# Patient Record
Sex: Female | Born: 2000 | Race: Black or African American | Hispanic: No | Marital: Married | Smoking: Former smoker
Health system: Southern US, Community
[De-identification: ages and names within clinical notes are randomized; demographics above are authoritative.]

## PROBLEM LIST (undated history)

## (undated) DIAGNOSIS — C801 Malignant (primary) neoplasm, unspecified: Secondary | ICD-10-CM

## (undated) DIAGNOSIS — T7840XA Allergy, unspecified, initial encounter: Secondary | ICD-10-CM

## (undated) HISTORY — PX: HERNIA REPAIR: SHX51

---

## 2020-03-13 ENCOUNTER — Other Ambulatory Visit: Payer: Self-pay

## 2020-03-13 ENCOUNTER — Ambulatory Visit (HOSPITAL_COMMUNITY)
Admission: EM | Admit: 2020-03-13 | Discharge: 2020-03-13 | Disposition: A | Discharge status: Home / Self Care | Payer: Managed Care, Other (non HMO) | Attending: Family Medicine | Admitting: Family Medicine

## 2020-03-13 ENCOUNTER — Encounter (HOSPITAL_COMMUNITY): Payer: Self-pay

## 2020-03-13 DIAGNOSIS — M545 Low back pain, unspecified: Secondary | ICD-10-CM | POA: Diagnosis not present

## 2020-03-13 HISTORY — DX: Allergy, unspecified, initial encounter: T78.40XA

## 2020-03-13 MED ORDER — IBUPROFEN 800 MG PO TABS: 800.0000 mg | ORAL_TABLET | Freq: Three times a day (TID) | ORAL | 0 refills | Status: DC

## 2020-03-13 MED ORDER — CYCLOBENZAPRINE HCL 5 MG PO TABS
5.0000 mg | ORAL_TABLET | Freq: Two times a day (BID) | ORAL | 0 refills | Status: DC | PRN
Start: 2020-03-13 — End: 2021-06-14

## 2020-03-13 NOTE — Discharge Instructions (Signed)
Use anti-inflammatories for pain/swelling. You may take up to 800 mg Ibuprofen every 8 hours with food. You may supplement Ibuprofen with Tylenol [phone] mg every 8 hours.   You may use flexeril as needed to help with pain. This is a muscle relaxer and causes sedation- please use only at bedtime or when you will be home and not have to drive/work  Gentle stretching Alternate ice and heat  Follow up if not improving or worsening

## 2020-03-13 NOTE — ED Triage Notes (Signed)
Pt presents with bilateral lower back pain since yesterday.

## 2020-03-15 NOTE — ED Provider Notes (Signed)
[place]    CSN: [id number] Arrival date & time: 03/13/20  1610      History   Chief Complaint Chief Complaint  Patient presents with  . Back Pain    HPI Laura Sharp is a 19 y.o. female presenting today for evaluation of back pain.  Reports that she has had lower back pain for approximately 1 to 2 days.  Symptoms began yesterday.  Denies any specific injury or trauma.  Denies radiation into extremities.  Denies numbness or tingling.  Denies saddle anesthesia.  Denies urinary symptoms or loss of control of bowel/bladder.  HPI  Past Medical History:  Diagnosis Date  . Allergy     There are no problems to display for this patient.   Past Surgical History:  Procedure Laterality Date  . HERNIA REPAIR      OB History   No obstetric history on file.      Home Medications    Prior to Admission medications   Medication Sig Start Date End Date Taking? Authorizing Provider  fluticasone (FLONASE) 50 MCG/ACT nasal spray Place into both nostrils daily.   Yes [provider]  cyclobenzaprine (FLEXERIL) 5 MG tablet Take 1-2 tablets (5-10 mg total) by mouth 2 (two) times daily as needed for muscle spasms. 03/13/20   Kaylee Wombles C, PA-C  ibuprofen (ADVIL) 800 MG tablet Take 1 tablet (800 mg total) by mouth 3 (three) times daily. 03/13/20   Gilman Olazabal, Elesa Hacker, PA-C    Family History Family History  Family history unknown: Yes    Social History Social History   Tobacco Use  . Smoking status: Current Some Day Smoker  Substance Use Topics  . Alcohol use: Not on file  . Drug use: Not on file     Allergies   Penicillins   Review of Systems Review of Systems  Constitutional: Negative for fatigue and fever.  Eyes: Negative for visual disturbance.  Respiratory: Negative for shortness of breath.   Cardiovascular: Negative for chest pain.  Gastrointestinal: Negative for abdominal pain, nausea and vomiting.  Genitourinary: Negative for  decreased urine volume and difficulty urinating.  Musculoskeletal: Positive for back pain and myalgias. Negative for arthralgias and joint swelling.  Skin: Negative for color change, rash and wound.  Neurological: Negative for dizziness, weakness, light-headedness and headaches.     Physical Exam Triage Vital Signs ED Triage Vitals  Enc Vitals Group     BP 03/13/20 1720 126/70     Pulse Rate 03/13/20 1720 81     Resp 03/13/20 1720 17     Temp 03/13/20 1720 98.6 F (37 C)     Temp Source 03/13/20 1720 Oral     SpO2 03/13/20 1720 99 %     Weight --      Height --      Head Circumference --      Peak Flow --      Pain Score 03/13/20 1715 9     Pain Loc --      Pain Edu? --      Excl. in [place]? --    No data found.  Updated Vital Signs BP 126/70 (BP Location: Right Arm)   Pulse 81   Temp 98.6 F (37 C) (Oral)   Resp 17   LMP 02/14/2020   SpO2 99%   Visual Acuity Right Eye Distance:   Left Eye Distance:   Bilateral Distance:    Right Eye Near:   Left Eye Near:  Bilateral Near:     Physical Exam Vitals and nursing note reviewed.  Constitutional:      Appearance: She is well-developed.     Comments: No acute distress  HENT:     Head: Normocephalic and atraumatic.     Nose: Nose normal.  Eyes:     Conjunctiva/sclera: Conjunctivae normal.  Cardiovascular:     Rate and Rhythm: Normal rate.  Pulmonary:     Effort: Pulmonary effort is normal. No respiratory distress.  Abdominal:     General: There is no distension.  Musculoskeletal:        General: Normal range of motion.     Cervical back: Neck supple.     Comments: Nontender to palpation of cervical and thoracic spine midline, diffuse tenderness throughout lumbar spine midline as well as bilateral lumbar musculature more laterally Strength at hips and knees 5/5 equal bilaterally, patellar reflex 2+ bilaterally Gait without abnormality, ambulates from chair to exam table without any assistance  Skin:     General: Skin is warm and dry.  Neurological:     Mental Status: She is alert and oriented to person, place, and time.      UC Treatments / Results  Labs (all labs ordered are listed, but only abnormal results are displayed) Labs Reviewed - No data to display  EKG   Radiology No results found.  Procedures Procedures (including critical care time)  Medications Ordered in UC Medications - No data to display  Initial Impression / Assessment and Plan / UC Course  I have reviewed the triage vital signs and the nursing notes.  Pertinent labs & imaging results that were available during my care of the patient were reviewed by me and considered in my medical decision making (see chart for details).     Low back pain without mechanism of injury, low suspicion of acute bony abnormality, recommending treatment with anti-inflammatories and muscle relaxers, activity modification and gentle stretching.  Monitor for gradual resolution.  Discussed strict return precautions. Patient verbalized understanding and is agreeable with plan.  Final Clinical Impressions(s) / UC Diagnoses   Final diagnoses:  Acute bilateral low back pain without sciatica     Discharge Instructions     Use anti-inflammatories for pain/swelling. You may take up to 800 mg Ibuprofen every 8 hours with food. You may supplement Ibuprofen with Tylenol [phone] mg every 8 hours.   You may use flexeril as needed to help with pain. This is a muscle relaxer and causes sedation- please use only at bedtime or when you will be home and not have to drive/work  Gentle stretching Alternate ice and heat  Follow up if not improving or worsening   ED Prescriptions    Medication Sig Dispense Auth. Provider   ibuprofen (ADVIL) 800 MG tablet Take 1 tablet (800 mg total) by mouth 3 (three) times daily. 21 tablet Kingstyn Deruiter C, PA-C   cyclobenzaprine (FLEXERIL) 5 MG tablet Take 1-2 tablets (5-10 mg total) by mouth 2 (two)  times daily as needed for muscle spasms. 24 tablet Nyilah Kight, [place] C, PA-C     PDMP not reviewed this encounter.   Janith Lima, [place] 03/15/20 1633

## 2020-05-23 ENCOUNTER — Other Ambulatory Visit: Payer: Managed Care, Other (non HMO)

## 2020-05-27 ENCOUNTER — Other Ambulatory Visit: Payer: Managed Care, Other (non HMO)

## 2020-05-28 ENCOUNTER — Other Ambulatory Visit: Payer: Managed Care, Other (non HMO)

## 2020-05-28 ENCOUNTER — Other Ambulatory Visit: Payer: Self-pay

## 2020-05-28 DIAGNOSIS — Z20822 Contact with and (suspected) exposure to covid-19: Secondary | ICD-10-CM

## 2020-05-30 LAB — NOVEL CORONAVIRUS, NAA

## 2020-05-30 LAB — SARS-COV-2, NAA 2 DAY TAT

## 2020-08-02 ENCOUNTER — Encounter (HOSPITAL_COMMUNITY): Payer: Self-pay | Admitting: Emergency Medicine

## 2020-08-02 ENCOUNTER — Emergency Department (HOSPITAL_COMMUNITY)
Admission: EM | Admit: 2020-08-02 | Discharge: 2020-08-02 | Disposition: A | Discharge status: Home / Self Care | Payer: Managed Care, Other (non HMO) | Attending: Emergency Medicine | Admitting: Emergency Medicine

## 2020-08-02 DIAGNOSIS — A64 Unspecified sexually transmitted disease: Secondary | ICD-10-CM | POA: Insufficient documentation

## 2020-08-02 DIAGNOSIS — F17200 Nicotine dependence, unspecified, uncomplicated: Secondary | ICD-10-CM | POA: Diagnosis not present

## 2020-08-02 DIAGNOSIS — N898 Other specified noninflammatory disorders of vagina: Secondary | ICD-10-CM | POA: Insufficient documentation

## 2020-08-02 LAB — POC URINE PREG, ED: Preg Test, Ur: NEGATIVE

## 2020-08-02 LAB — URINALYSIS, ROUTINE W REFLEX MICROSCOPIC
Bilirubin Urine: NEGATIVE
Glucose, UA: NEGATIVE mg/dL
Hgb urine dipstick: NEGATIVE
Ketones, ur: 5 mg/dL — AB
Nitrite: NEGATIVE
Protein, ur: NEGATIVE mg/dL
Specific Gravity, Urine: 1.026 (ref 1.005–1.030)
pH: 5 (ref 5.0–8.0)

## 2020-08-02 LAB — WET PREP, GENITAL
Sperm: NONE SEEN
Trich, Wet Prep: NONE SEEN
Yeast Wet Prep HPF POC: NONE SEEN

## 2020-08-02 LAB — HIV ANTIBODY (ROUTINE TESTING W REFLEX): HIV Screen 4th Generation wRfx: NONREACTIVE

## 2020-08-02 MED ORDER — DOXYCYCLINE HYCLATE 100 MG PO CAPS: 100.0000 mg | ORAL_CAPSULE | Freq: Two times a day (BID) | ORAL | 0 refills | Status: AC

## 2020-08-02 MED ORDER — METRONIDAZOLE 500 MG PO TABS: 500.0000 mg | ORAL_TABLET | Freq: Two times a day (BID) | ORAL | 0 refills | Status: AC

## 2020-08-02 MED ORDER — CEFTRIAXONE SODIUM 500 MG IJ SOLR
500.0000 mg | Freq: Once | INTRAMUSCULAR | Status: AC
  Administered 2020-08-02: 500 mg via INTRAMUSCULAR
  Filled 2020-08-02: qty 500

## 2020-08-02 MED ORDER — STERILE WATER FOR INJECTION IJ SOLN
INTRAMUSCULAR | Status: AC
  Administered 2020-08-02: 10 mL
  Filled 2020-08-02: qty 10

## 2020-08-02 NOTE — Discharge Instructions (Addendum)
You were given a prescription for 2 antibiotics called Flagyl and doxycycline.  You will need to fill the prescription for the Flagyl.  Do not drink alcohol taking this.  If your chlamydia test is positive you will need to fill the prescription and take the doxycycline as well.  You have been tested for HIV, syphilis, chlamydia and gonorrhea.  These results will be available in approximately 3 days and you will be contacted by the hospital if the results are positive. Avoid sexual contact until you are aware of the results, and please inform all sexual partners if you test positive for any of these diseases.  Please follow up with your primary care provider within 5-7 days for re-evaluation of your symptoms. If you do not have a primary care provider, information for a healthcare clinic has been provided for you to make arrangements for follow up care. Please return to the emergency department for any new or worsening symptoms.

## 2020-08-02 NOTE — ED Triage Notes (Addendum)
Requesting STD testing.  Reports vaginal discharge and odor that started today.

## 2020-08-02 NOTE — ED Provider Notes (Signed)
[facility] EMERGENCY DEPARTMENT Provider Note   CSN: [id number] Arrival date & time: 08/02/20  1324     History Chief Complaint  Patient presents with  . SEXUALLY TRANSMITTED DISEASE    Laura Sharp is a 20 y.o. female.  HPI   20 year old female with a history of allergies presents the emergency department today for evaluation of vaginal discharge.  States she had unprotected intercourse with her boyfriend a week ago who had penile discharge.  She noted vaginal discharge today.  She denies any pelvic pain, bleeding or other associated symptoms.  She is requesting an STD check.  Past Medical History:  Diagnosis Date  . Allergy     There are no problems to display for this patient.   Past Surgical History:  Procedure Laterality Date  . HERNIA REPAIR       OB History   No obstetric history on file.     Family History  Family history unknown: Yes    Social History   Tobacco Use  . Smoking status: Current Some Day Smoker    Home Medications Prior to Admission medications   Medication Sig Start Date End Date Taking? Authorizing Provider  doxycycline (VIBRAMYCIN) 100 MG capsule Take 1 capsule (100 mg total) by mouth 2 (two) times daily for 7 days. 08/02/20 08/09/20 Yes Cheryll Keisler S, PA-C  metroNIDAZOLE (FLAGYL) 500 MG tablet Take 1 tablet (500 mg total) by mouth 2 (two) times daily for 7 days. 08/02/20 08/09/20 Yes Rylinn Linzy S, PA-C  cyclobenzaprine (FLEXERIL) 5 MG tablet Take 1-2 tablets (5-10 mg total) by mouth 2 (two) times daily as needed for muscle spasms. 03/13/20   Wieters, Hallie C, PA-C  fluticasone (FLONASE) 50 MCG/ACT nasal spray Place into both nostrils daily.    [provider]  ibuprofen (ADVIL) 800 MG tablet Take 1 tablet (800 mg total) by mouth 3 (three) times daily. 03/13/20   Wieters, Hallie C, PA-C    Allergies    Penicillins  Review of Systems   Review of Systems  Constitutional: Negative for fever.  Eyes:  Negative for visual disturbance.  Respiratory: Negative for shortness of breath.   Cardiovascular: Negative for chest pain.  Gastrointestinal: Negative for abdominal pain.  Genitourinary: Positive for vaginal discharge. Negative for dysuria, frequency, pelvic pain, urgency and vaginal bleeding.  Musculoskeletal: Negative for back pain.    Physical Exam Updated Vital Signs BP 126/67 (BP Location: Right Arm)   Pulse 60   Temp 99 F (37.2 C) (Oral)   Resp 16   LMP 07/26/2020   SpO2 98%   Physical Exam Vitals and nursing note reviewed.  Constitutional:      General: She is not in acute distress.    Appearance: She is well-developed.  HENT:     Head: Normocephalic and atraumatic.  Eyes:     Conjunctiva/sclera: Conjunctivae normal.  Cardiovascular:     Rate and Rhythm: Normal rate.  Pulmonary:     Effort: Pulmonary effort is normal.  Genitourinary:    Comments: Exam performed by Rodney Booze,  exam chaperoned Date: 08/02/2020 Pelvic exam: normal external genitalia without evidence of trauma. VULVA: normal appearing vulva with no masses, tenderness or lesion. VAGINA: normal appearing vagina with normal color and discharge, no lesions. CERVIX: normal appearing cervix without lesions, cervical os with out purulent discharge; vaginal discharge - no significant discharge present, Wet prep and DNA probe for chlamydia and GC obtained.   Bimanual exam deferred Musculoskeletal:  General: Normal range of motion.     Cervical back: Neck supple.  Skin:    General: Skin is warm and dry.  Neurological:     Mental Status: She is alert.     ED Results / Procedures / Treatments   Labs (all labs ordered are listed, but only abnormal results are displayed) Labs Reviewed  WET PREP, GENITAL - Abnormal; Notable for the following components:      Result Value   Clue Cells Wet Prep HPF POC PRESENT (*)    WBC, Wet Prep HPF POC MODERATE (*)    All other components within normal  limits  URINALYSIS, ROUTINE W REFLEX MICROSCOPIC - Abnormal; Notable for the following components:   APPearance HAZY (*)    Ketones, ur 5 (*)    Leukocytes,Ua TRACE (*)    Bacteria, UA RARE (*)    All other components within normal limits  HIV ANTIBODY (ROUTINE TESTING W REFLEX)  RPR  POC URINE PREG, ED  GC/CHLAMYDIA PROBE AMP ([place]) NOT AT [facility]    EKG None  Radiology No results found.  Procedures Procedures   Medications Ordered in ED Medications  cefTRIAXone (ROCEPHIN) injection 500 mg (500 mg Intramuscular Given 08/02/20 1616)  sterile water (preservative free) injection (10 mLs  Given 08/02/20 1617)    ED Course  I have reviewed the triage vital signs and the nursing notes.  Pertinent labs & imaging results that were available during my care of the patient were reviewed by me and considered in my medical decision making (see chart for details).    MDM Rules/Calculators/A&P                          Patient to be discharged with instructions to follow up with OBGYN. Discussed importance of using protection when sexually active. Pt understands that they have GC/Chlamydia cultures pending and that they will need to inform all sexual partners if results return positive. Pt has been treated prophylacticly with rocephin due to pts history, pelvic exam, and wet prep with increased WBCs. hx not concerning for PID because hemodynamically stable and pelvic pain present. Pt has also been treated with flagyl for Bacterial Vaginosis. Pt has been advised to not drink alcohol while on this medication.   Final Clinical Impression(s) / ED Diagnoses Final diagnoses:  Vaginal discharge    Rx / DC Orders ED Discharge Orders         Ordered    doxycycline (VIBRAMYCIN) 100 MG capsule  2 times daily        08/02/20 1610    metroNIDAZOLE (FLAGYL) 500 MG tablet  2 times daily        08/02/20 [address] [place], PA-C 08/02/20 1622    Daleen Bo,  MD 08/02/20 1842

## 2020-08-02 NOTE — ED Notes (Signed)
Pelvic cart at bedside. 

## 2020-08-03 LAB — RPR: RPR Ser Ql: NONREACTIVE

## 2020-08-04 LAB — GC/CHLAMYDIA PROBE AMP (~~LOC~~) NOT AT ARMC
Chlamydia: NEGATIVE
Comment: NEGATIVE
Comment: NORMAL
Neisseria Gonorrhea: POSITIVE — AB

## 2020-12-21 ENCOUNTER — Encounter (HOSPITAL_COMMUNITY): Payer: Self-pay | Admitting: Emergency Medicine

## 2020-12-21 ENCOUNTER — Emergency Department (HOSPITAL_COMMUNITY)
Admission: EM | Admit: 2020-12-21 | Discharge: 2020-12-21 | Disposition: A | Discharge status: Home / Self Care | Payer: Managed Care, Other (non HMO) | Attending: Emergency Medicine | Admitting: Emergency Medicine

## 2020-12-21 ENCOUNTER — Other Ambulatory Visit: Payer: Self-pay

## 2020-12-21 DIAGNOSIS — R350 Frequency of micturition: Secondary | ICD-10-CM | POA: Diagnosis not present

## 2020-12-21 DIAGNOSIS — H538 Other visual disturbances: Secondary | ICD-10-CM | POA: Insufficient documentation

## 2020-12-21 DIAGNOSIS — R1032 Left lower quadrant pain: Secondary | ICD-10-CM | POA: Diagnosis not present

## 2020-12-21 DIAGNOSIS — Z5321 Procedure and treatment not carried out due to patient leaving prior to being seen by health care provider: Secondary | ICD-10-CM | POA: Insufficient documentation

## 2020-12-21 LAB — CBC WITH DIFFERENTIAL/PLATELET
Abs Immature Granulocytes: 0.02 10*3/uL (ref 0.00–0.07)
Basophils Absolute: 0 10*3/uL (ref 0.0–0.1)
Basophils Relative: 1 %
Eosinophils Absolute: 0.1 10*3/uL (ref 0.0–0.5)
Eosinophils Relative: 1 %
HCT: 36.3 % (ref 36.0–46.0)
Hemoglobin: 12.1 g/dL (ref 12.0–15.0)
Immature Granulocytes: 0 %
Lymphocytes Relative: 34 %
Lymphs Abs: 2.3 10*3/uL (ref 0.7–4.0)
MCH: 33.3 pg (ref 26.0–34.0)
MCHC: 33.3 g/dL (ref 30.0–36.0)
MCV: 100 fL (ref 80.0–100.0)
Monocytes Absolute: 0.5 10*3/uL (ref 0.1–1.0)
Monocytes Relative: 8 %
Neutro Abs: 3.8 10*3/uL (ref 1.7–7.7)
Neutrophils Relative %: 56 %
Platelets: 270 10*3/uL (ref 150–400)
RBC: 3.63 MIL/uL — ABNORMAL LOW (ref 3.87–5.11)
RDW: 12 % (ref 11.5–15.5)
WBC: 6.7 10*3/uL (ref 4.0–10.5)
nRBC: 0 % (ref 0.0–0.2)

## 2020-12-21 LAB — URINALYSIS, ROUTINE W REFLEX MICROSCOPIC
Bilirubin Urine: NEGATIVE
Glucose, UA: NEGATIVE mg/dL
Ketones, ur: NEGATIVE mg/dL
Leukocytes,Ua: NEGATIVE
Nitrite: NEGATIVE
Protein, ur: NEGATIVE mg/dL
RBC / HPF: >50 RBC/hpf — ABNORMAL HIGH (ref 0–5)
Specific Gravity, Urine: 1.018 (ref 1.005–1.030)
pH: 5 (ref 5.0–8.0)

## 2020-12-21 LAB — COMPREHENSIVE METABOLIC PANEL
ALT: 14 U/L (ref 0–44)
AST: 13 U/L — ABNORMAL LOW (ref 15–41)
Albumin: 3.9 g/dL (ref 3.5–5.0)
Alkaline Phosphatase: 36 U/L — ABNORMAL LOW (ref 38–126)
Anion gap: 6 (ref 5–15)
BUN: 7 mg/dL (ref 6–20)
CO2: 24 mmol/L (ref 22–32)
Calcium: 8.9 mg/dL (ref 8.9–10.3)
Chloride: 107 mmol/L (ref 98–111)
Creatinine, Ser: 0.8 mg/dL (ref 0.44–1.00)
GFR, Estimated: >60 mL/min (ref >60)
Glucose, Bld: 104 mg/dL — ABNORMAL HIGH (ref 70–99)
Potassium: 3.5 mmol/L (ref 3.5–5.1)
Sodium: 137 mmol/L (ref 135–145)
Total Bilirubin: 1 mg/dL (ref 0.3–1.2)
Total Protein: 6.8 g/dL (ref 6.5–8.1)

## 2020-12-21 LAB — POC URINE PREG, ED: Preg Test, Ur: NEGATIVE

## 2020-12-21 LAB — CBG MONITORING, ED: Glucose-Capillary: 88 mg/dL (ref 70–99)

## 2020-12-21 LAB — LIPASE, BLOOD: Lipase: 38 U/L (ref 11–51)

## 2020-12-21 NOTE — ED Notes (Signed)
Patient states she has to work at [employer] and is leaving. States she will make a doctor appointment

## 2020-12-21 NOTE — ED Triage Notes (Signed)
C/o LLQ cramps x 1 week with frequent urination.  Denies nausea, vomiting, diarrhea, and constipation.  Pt also requesting to be tested for diabetes.  Reports blurred vision when she bends over and stands back up for 3-4 months and she believes it may be related to diabetes.

## 2020-12-21 NOTE — ED Provider Notes (Signed)
Emergency Medicine Provider Triage Evaluation Note  Laura Sharp , a 20 y.o. female  was evaluated in triage.  Pt complains of intermittent left lower abdominal cramping x1 week.  Patient also having frequent urination and noticed a malodorous smell to vaginal area.  Denies any vaginal discharge.  LMP was finished x3 days ago.  Patient admits to being sexually active with males and females with protection.  She is also concerned to be diabetic as she was told she is prediabetic in the past..  Review of Systems  Positive: Abdominal pain, urinary frequency Negative: Fever, chills, dysuria, gross hematuria, vaginal discharge  Physical Exam  BP 135/74 (BP Location: Right Arm)   Pulse 78   Temp 98.1 F (36.7 C)   Resp 14   LMP 12/18/2020   SpO2 98%  Gen:   Awake, no distress   Resp:  Normal effort  MSK:   Moves extremities without difficulty  Other:  Mild tenderness to palpation of left lower quadrant without peritoneal signs.  Medical Decision Making  Medically screening exam initiated at 6:21 PM.  Appropriate orders placed.  Laura Sharp was informed that the remainder of the evaluation will be completed by another provider, this initial triage assessment does not replace that evaluation, and the importance of remaining in the ED until their evaluation is complete.  Abdominal labs ordered.    Portions of this note were generated with Lobbyist. Dictation errors may occur despite best attempts at proofreading.    Barrie Folk, PA-C 12/21/20 1823    Jeanell Sparrow, DO 12/21/20 2212

## 2020-12-30 ENCOUNTER — Emergency Department (HOSPITAL_COMMUNITY)
Admission: EM | Admit: 2020-12-30 | Discharge: 2020-12-30 | Disposition: A | Discharge status: Home / Self Care | Payer: Managed Care, Other (non HMO) | Attending: Emergency Medicine | Admitting: Emergency Medicine

## 2020-12-30 ENCOUNTER — Other Ambulatory Visit: Payer: Self-pay

## 2020-12-30 ENCOUNTER — Encounter (HOSPITAL_COMMUNITY): Payer: Self-pay | Admitting: Emergency Medicine

## 2020-12-30 DIAGNOSIS — F17200 Nicotine dependence, unspecified, uncomplicated: Secondary | ICD-10-CM | POA: Insufficient documentation

## 2020-12-30 DIAGNOSIS — Z202 Contact with and (suspected) exposure to infections with a predominantly sexual mode of transmission: Secondary | ICD-10-CM | POA: Diagnosis present

## 2020-12-30 DIAGNOSIS — Z7689 Persons encountering health services in other specified circumstances: Secondary | ICD-10-CM

## 2020-12-30 LAB — URINALYSIS, ROUTINE W REFLEX MICROSCOPIC
Bilirubin Urine: NEGATIVE
Glucose, UA: NEGATIVE mg/dL
Hgb urine dipstick: NEGATIVE
Ketones, ur: NEGATIVE mg/dL
Leukocytes,Ua: NEGATIVE
Nitrite: NEGATIVE
Protein, ur: NEGATIVE mg/dL
Specific Gravity, Urine: 1.024 (ref 1.005–1.030)
pH: 6 (ref 5.0–8.0)

## 2020-12-30 LAB — GC/CHLAMYDIA PROBE AMP (~~LOC~~) NOT AT ARMC
Chlamydia: NEGATIVE
Comment: NEGATIVE
Comment: NORMAL
Neisseria Gonorrhea: NEGATIVE

## 2020-12-30 LAB — HIV ANTIBODY (ROUTINE TESTING W REFLEX): HIV Screen 4th Generation wRfx: NONREACTIVE

## 2020-12-30 LAB — PREGNANCY, URINE: Preg Test, Ur: NEGATIVE

## 2020-12-30 LAB — RPR: RPR Ser Ql: NONREACTIVE

## 2020-12-30 MED ORDER — DOXYCYCLINE HYCLATE 100 MG PO TABS
100.0000 mg | ORAL_TABLET | Freq: Once | ORAL | Status: AC
  Administered 2020-12-30: 100 mg via ORAL
  Filled 2020-12-30: qty 1

## 2020-12-30 MED ORDER — CEFTRIAXONE SODIUM 500 MG IJ SOLR
500.0000 mg | Freq: Once | INTRAMUSCULAR | Status: AC
  Administered 2020-12-30: 500 mg via INTRAMUSCULAR
  Filled 2020-12-30: qty 500

## 2020-12-30 MED ORDER — DOXYCYCLINE HYCLATE 100 MG PO CAPS: 100.0000 mg | ORAL_CAPSULE | Freq: Two times a day (BID) | ORAL | 0 refills | Status: DC

## 2020-12-30 NOTE — ED Provider Notes (Signed)
[facility] EMERGENCY DEPARTMENT Provider Note   CSN: [id number] Arrival date & time: 12/30/20  0016     History Chief Complaint  Patient presents with   STD check    Laura Sharp is a 20 y.o. female presents to the emergency department with concerns for possible STD exposure.  She reports that her boyfriend is having penile discharge and she is concerned that it may be secondary to an STD.  Has previously had chlamydia.  Denies abdominal pain, nausea, vomiting, diarrhea, weakness, dizziness, syncope, dysuria.  Denies vaginal discharge.  Last menstrual cycle 12/18/2020.  The history is provided by the patient and medical records. No language interpreter was used.      Past Medical History:  Diagnosis Date   Allergy     There are no problems to display for this patient.   Past Surgical History:  Procedure Laterality Date   HERNIA REPAIR       OB History   No obstetric history on file.     Family History  Family history unknown: Yes    Social History   Tobacco Use   Smoking status: Some Days   Smokeless tobacco: Never  Substance Use Topics   Alcohol use: Not Currently   Drug use: Not Currently    Home Medications Prior to Admission medications   Medication Sig Start Date End Date Taking? Authorizing Provider  doxycycline (VIBRAMYCIN) 100 MG capsule Take 1 capsule (100 mg total) by mouth 2 (two) times daily. 12/30/20  Yes Rolly Magri, Jarrett Soho, PA-C  cyclobenzaprine (FLEXERIL) 5 MG tablet Take 1-2 tablets (5-10 mg total) by mouth 2 (two) times daily as needed for muscle spasms. 03/13/20   Wieters, Hallie C, PA-C  fluticasone (FLONASE) 50 MCG/ACT nasal spray Place into both nostrils daily.    [provider]  ibuprofen (ADVIL) 800 MG tablet Take 1 tablet (800 mg total) by mouth 3 (three) times daily. 03/13/20   Wieters, Hallie C, PA-C    Allergies    Penicillins  Review of Systems   Review of Systems  Constitutional:  Negative  for appetite change, diaphoresis, fatigue, fever and unexpected weight change.  HENT:  Negative for mouth sores.   Eyes:  Negative for visual disturbance.  Respiratory:  Negative for cough, chest tightness, shortness of breath and wheezing.   Cardiovascular:  Negative for chest pain.  Gastrointestinal:  Negative for abdominal pain, constipation, diarrhea, nausea and vomiting.  Endocrine: Negative for polydipsia, polyphagia and polyuria.  Genitourinary:  Negative for dysuria, frequency, hematuria and urgency.  Musculoskeletal:  Negative for back pain and neck stiffness.  Skin:  Negative for rash.  Allergic/Immunologic: Negative for immunocompromised state.  Neurological:  Negative for syncope, light-headedness and headaches.  Hematological:  Does not bruise/bleed easily.  Psychiatric/Behavioral:  Negative for sleep disturbance. The patient is not nervous/anxious.    Physical Exam Updated Vital Signs BP 123/67 (BP Location: Right Arm)   Pulse 65   Temp 98.1 F (36.7 C) (Oral)   Resp 17   LMP 12/18/2020   SpO2 98%   Physical Exam Vitals and nursing note reviewed.  Constitutional:      General: She is not in acute distress.    Appearance: She is well-developed. She is not ill-appearing.  HENT:     Head: Normocephalic.  Eyes:     General: No scleral icterus.    Conjunctiva/sclera: Conjunctivae normal.  Cardiovascular:     Rate and Rhythm: Normal rate.  Pulmonary:     Effort: Pulmonary  effort is normal.  Abdominal:     General: There is no distension.     Tenderness: There is no abdominal tenderness.  Musculoskeletal:        General: Normal range of motion.     Cervical back: Normal range of motion.  Skin:    General: Skin is warm and dry.  Neurological:     Mental Status: She is alert.  Psychiatric:        Mood and Affect: Mood normal.    ED Results / Procedures / Treatments   Labs (all labs ordered are listed, but only abnormal results are displayed) Labs Reviewed   URINALYSIS, ROUTINE W REFLEX MICROSCOPIC  PREGNANCY, URINE  RPR  HIV ANTIBODY (ROUTINE TESTING W REFLEX)  GC/CHLAMYDIA PROBE AMP ([place]) NOT AT [facility]     Procedures Procedures   Medications Ordered in ED Medications  cefTRIAXone (ROCEPHIN) injection 500 mg (500 mg Intramuscular Given 12/30/20 0214)  doxycycline (VIBRA-TABS) tablet 100 mg (100 mg Oral Given 12/30/20 0210)    ED Course  I have reviewed the triage vital signs and the nursing notes.  Pertinent labs & imaging results that were available during my care of the patient were reviewed by me and considered in my medical decision making (see chart for details).    MDM Rules/Calculators/A&P                           Pt presents with concerns for possible STD.  Pt understands that they have GC/Chlamydia cultures pending and that they will need to inform all sexual partners if results return positive. Pt has been treated prophylactically with doxycycline and Rocephin due to pts history, and wet prep with increased WBCs. Pt not concerning for PID because hemodynamically stable, afebrile and without abdominal pain. Patient to be discharged with instructions to follow up with OBGYN/PCP. Discussed importance of using protection when sexually active.    Final Clinical Impression(s) / ED Diagnoses Final diagnoses:  Encounter for assessment of STD exposure    Rx / DC Orders ED Discharge Orders          Ordered    doxycycline (VIBRAMYCIN) 100 MG capsule  2 times daily        12/30/20 0047             Weiland Tomich, Jarrett Soho, PA-C 12/30/20 0454    Quintella Reichert, MD 12/30/20 [phone]

## 2020-12-30 NOTE — Discharge Instructions (Addendum)
1. Medications: Doxycycline, usual home medications 2. Treatment: rest, drink plenty of fluids, use a condom with every sexual encounter 3. Follow Up: Please followup with your primary doctor in 3 days for discussion of your diagnoses and further evaluation after today's visit; if you do not have a primary care doctor use the resource guide provided to find one; Please return to the ER for worsening symptoms, high fevers or persistent vomiting.  You have been tested for HIV, syphilis, chlamydia and gonorrhea.  These results will be available in approximately 3 days.  Please inform all sexual partners if you test positive for any of these diseases.

## 2020-12-30 NOTE — ED Triage Notes (Signed)
Patient requesting STD screening . No symptoms .

## 2020-12-30 NOTE — ED Notes (Signed)
Pt discharged and ambulated out of the ED without difficulty. 

## 2021-06-13 ENCOUNTER — Other Ambulatory Visit: Payer: Self-pay

## 2021-06-13 ENCOUNTER — Emergency Department (HOSPITAL_COMMUNITY): Payer: Managed Care, Other (non HMO)

## 2021-06-13 ENCOUNTER — Encounter (HOSPITAL_COMMUNITY): Payer: Self-pay | Admitting: Emergency Medicine

## 2021-06-13 ENCOUNTER — Inpatient Hospital Stay (HOSPITAL_COMMUNITY)
Admission: EM | Admit: 2021-06-13 | Discharge: 2021-06-14 | DRG: 054 | Disposition: A | Discharge status: Home / Self Care | Payer: Managed Care, Other (non HMO) | Attending: Internal Medicine | Admitting: Internal Medicine

## 2021-06-13 DIAGNOSIS — D72829 Elevated white blood cell count, unspecified: Secondary | ICD-10-CM | POA: Diagnosis present

## 2021-06-13 DIAGNOSIS — E871 Hypo-osmolality and hyponatremia: Secondary | ICD-10-CM | POA: Diagnosis present

## 2021-06-13 DIAGNOSIS — R569 Unspecified convulsions: Secondary | ICD-10-CM

## 2021-06-13 DIAGNOSIS — G936 Cerebral edema: Secondary | ICD-10-CM | POA: Diagnosis present

## 2021-06-13 DIAGNOSIS — G9389 Other specified disorders of brain: Secondary | ICD-10-CM

## 2021-06-13 DIAGNOSIS — Z20822 Contact with and (suspected) exposure to covid-19: Secondary | ICD-10-CM | POA: Diagnosis present

## 2021-06-13 DIAGNOSIS — Z880 Allergy status to penicillin: Secondary | ICD-10-CM

## 2021-06-13 DIAGNOSIS — F17210 Nicotine dependence, cigarettes, uncomplicated: Secondary | ICD-10-CM | POA: Diagnosis present

## 2021-06-13 DIAGNOSIS — D499 Neoplasm of unspecified behavior of unspecified site: Secondary | ICD-10-CM | POA: Insufficient documentation

## 2021-06-13 DIAGNOSIS — C719 Malignant neoplasm of brain, unspecified: Secondary | ICD-10-CM | POA: Diagnosis present

## 2021-06-13 DIAGNOSIS — Z79899 Other long term (current) drug therapy: Secondary | ICD-10-CM | POA: Diagnosis not present

## 2021-06-13 DIAGNOSIS — Z9114 Patient's other noncompliance with medication regimen: Secondary | ICD-10-CM | POA: Diagnosis not present

## 2021-06-13 DIAGNOSIS — Z331 Pregnant state, incidental: Secondary | ICD-10-CM

## 2021-06-13 DIAGNOSIS — R4701 Aphasia: Secondary | ICD-10-CM | POA: Diagnosis present

## 2021-06-13 DIAGNOSIS — D496 Neoplasm of unspecified behavior of brain: Secondary | ICD-10-CM | POA: Diagnosis present

## 2021-06-13 DIAGNOSIS — R531 Weakness: Secondary | ICD-10-CM

## 2021-06-13 LAB — COMPREHENSIVE METABOLIC PANEL
ALT: 19 U/L (ref 0–44)
AST: 18 U/L (ref 15–41)
Albumin: 4.1 g/dL (ref 3.5–5.0)
Alkaline Phosphatase: 37 U/L — ABNORMAL LOW (ref 38–126)
Anion gap: 10 (ref 5–15)
BUN: 6 mg/dL (ref 6–20)
CO2: 16 mmol/L — ABNORMAL LOW (ref 22–32)
Calcium: 8.9 mg/dL (ref 8.9–10.3)
Chloride: 105 mmol/L (ref 98–111)
Creatinine, Ser: 0.79 mg/dL (ref 0.44–1.00)
GFR, Estimated: >60 mL/min (ref >60)
Glucose, Bld: 144 mg/dL — ABNORMAL HIGH (ref 70–99)
Potassium: 3.7 mmol/L (ref 3.5–5.1)
Sodium: 131 mmol/L — ABNORMAL LOW (ref 135–145)
Total Bilirubin: 0.6 mg/dL (ref 0.3–1.2)
Total Protein: 7.3 g/dL (ref 6.5–8.1)

## 2021-06-13 LAB — CBC WITH DIFFERENTIAL/PLATELET
Abs Immature Granulocytes: 0.15 10*3/uL — ABNORMAL HIGH (ref 0.00–0.07)
Basophils Absolute: 0.1 10*3/uL (ref 0.0–0.1)
Basophils Relative: 0 %
Eosinophils Absolute: 0 10*3/uL (ref 0.0–0.5)
Eosinophils Relative: 0 %
HCT: 39.3 % (ref 36.0–46.0)
Hemoglobin: 13.6 g/dL (ref 12.0–15.0)
Immature Granulocytes: 1 %
Lymphocytes Relative: 5 %
Lymphs Abs: 0.7 10*3/uL (ref 0.7–4.0)
MCH: 33.8 pg (ref 26.0–34.0)
MCHC: 34.6 g/dL (ref 30.0–36.0)
MCV: 97.8 fL (ref 80.0–100.0)
Monocytes Absolute: 0.9 10*3/uL (ref 0.1–1.0)
Monocytes Relative: 6 %
Neutro Abs: 13.2 10*3/uL — ABNORMAL HIGH (ref 1.7–7.7)
Neutrophils Relative %: 88 %
Platelets: 302 10*3/uL (ref 150–400)
RBC: 4.02 MIL/uL (ref 3.87–5.11)
RDW: 11.9 % (ref 11.5–15.5)
WBC: 15 10*3/uL — ABNORMAL HIGH (ref 4.0–10.5)
nRBC: 0 % (ref 0.0–0.2)

## 2021-06-13 LAB — PROTIME-INR
INR: 1.1 (ref 0.8–1.2)
Prothrombin Time: 14.4 seconds (ref 11.4–15.2)

## 2021-06-13 LAB — RESP PANEL BY RT-PCR (FLU A&B, COVID) ARPGX2
Influenza A by PCR: NEGATIVE
Influenza B by PCR: NEGATIVE
SARS Coronavirus 2 by RT PCR: NEGATIVE

## 2021-06-13 LAB — CBG MONITORING, ED: Glucose-Capillary: 142 mg/dL — ABNORMAL HIGH (ref 70–99)

## 2021-06-13 LAB — HCG, QUANTITATIVE, PREGNANCY: hCG, Beta Chain, Quant, S: 132 m[IU]/mL — ABNORMAL HIGH (ref <5)

## 2021-06-13 MED ORDER — DEXAMETHASONE SODIUM PHOSPHATE 4 MG/ML IJ SOLN
4.0000 mg | Freq: Three times a day (TID) | INTRAMUSCULAR | Status: DC
  Administered 2021-06-13 – 2021-06-14 (×3): 4 mg via INTRAVENOUS
  Filled 2021-06-13 (×3): qty 1

## 2021-06-13 MED ORDER — ACETAMINOPHEN 325 MG PO TABS
650.0000 mg | ORAL_TABLET | Freq: Four times a day (QID) | ORAL | Status: DC | PRN
  Administered 2021-06-13: 650 mg via ORAL
  Filled 2021-06-13: qty 2

## 2021-06-13 MED ORDER — ALBUTEROL SULFATE (2.5 MG/3ML) 0.083% IN NEBU: 2.5000 mg | INHALATION_SOLUTION | Freq: Four times a day (QID) | RESPIRATORY_TRACT | Status: DC | PRN

## 2021-06-13 MED ORDER — ONDANSETRON HCL 4 MG/2ML IJ SOLN
4.0000 mg | Freq: Four times a day (QID) | INTRAMUSCULAR | Status: DC | PRN
  Administered 2021-06-13: 4 mg via INTRAVENOUS

## 2021-06-13 MED ORDER — LEVETIRACETAM IN NACL 500 MG/100ML IV SOLN
500.0000 mg | Freq: Two times a day (BID) | INTRAVENOUS | Status: DC
  Administered 2021-06-13 – 2021-06-14 (×2): 500 mg via INTRAVENOUS
  Filled 2021-06-13 (×3): qty 100

## 2021-06-13 MED ORDER — ONDANSETRON HCL 4 MG PO TABS: 4.0000 mg | ORAL_TABLET | Freq: Four times a day (QID) | ORAL | Status: DC | PRN

## 2021-06-13 MED ORDER — SODIUM CHLORIDE 0.9 % IV SOLN: INTRAVENOUS | Status: DC

## 2021-06-13 MED ORDER — SODIUM CHLORIDE 0.9% FLUSH
3.0000 mL | Freq: Two times a day (BID) | INTRAVENOUS | Status: DC
  Administered 2021-06-13: 3 mL via INTRAVENOUS

## 2021-06-13 MED ORDER — DEXAMETHASONE SODIUM PHOSPHATE 10 MG/ML IJ SOLN
10.0000 mg | Freq: Once | INTRAMUSCULAR | Status: AC
  Administered 2021-06-13: 10 mg via INTRAVENOUS
  Filled 2021-06-13: qty 1

## 2021-06-13 MED ORDER — ENOXAPARIN SODIUM 40 MG/0.4ML IJ SOSY
40.0000 mg | PREFILLED_SYRINGE | INTRAMUSCULAR | Status: DC
  Administered 2021-06-13: 40 mg via SUBCUTANEOUS
  Filled 2021-06-13: qty 0.4

## 2021-06-13 MED ORDER — LEVETIRACETAM IN NACL 1000 MG/100ML IV SOLN
1000.0000 mg | Freq: Once | INTRAVENOUS | Status: AC
  Administered 2021-06-13: 1000 mg via INTRAVENOUS
  Filled 2021-06-13: qty 100

## 2021-06-13 MED ORDER — LORAZEPAM 2 MG/ML IJ SOLN: 1.0000 mg | INTRAMUSCULAR | Status: DC | PRN

## 2021-06-13 MED ORDER — ACETAMINOPHEN 650 MG RE SUPP: 650.0000 mg | Freq: Four times a day (QID) | RECTAL | Status: DC | PRN

## 2021-06-13 NOTE — ED Provider Notes (Signed)
Care of the patient assumed at the change of shift. Here after seizure x 2. Diagnosed with brain tumor 6 weeks ago in [place], but declined surgery then seeking a second opinion. Also apparently stopped taking her antiepileptics recently. Physical Exam  BP 125/72    Pulse (!) 59    Resp 15    SpO2 99%   Physical Exam Sleeping but arousable Pupils equal Moves all four extremities but right is weaker Does not speak or follow commands Procedures  Procedures  ED Course / MDM   Clinical Course as of 06/13/21 1659  Sat Jun 13, 2021  1601 CT images reviewed, large L frontal mass with edema and midline shift. Decadron given. Will discuss with Neurosurgery.  [CS]  5621 Per MRI tech, patient began gagging/vomiting while in MRI machine. Given her AMS they did not feel comfortable leaving her supine and brought her back. I spoke with Ms. Reinaldo Meeker with Neurosurgery who has reviewed CT with Dr. Annette Stable. They agree with decadron. MRI is not needed emergently so can hold off on intubating her for airway protection for now. Will admit to medicine. NSurg will follow.  [CS]  1659 Spoke with Dr. Harvest Forest, Hospitalist, who will evaluate for admission.  [CS]    Clinical Course User Index [CS] Truddie Hidden, MD   Medical Decision Making Problems Addressed: Brain mass: chronic illness or injury with exacerbation, progression, or side effects of treatment Seizure [facility]): acute illness or injury that poses a threat to life or bodily functions  Amount and/or Complexity of Data Reviewed Labs: ordered. Decision-making details documented in ED Course. Radiology: ordered and independent interpretation performed. Decision-making details documented in ED Course.  Risk Prescription drug management. Decision regarding hospitalization.  Critical Care Total time providing critical care: 30-74 minutes         Truddie Hidden, MD 06/13/21 1700

## 2021-06-13 NOTE — H&P (Addendum)
History and Physical    Patient: Laura Sharp ACZ:[id number] DOB: 03-28-2001 DOA: 06/13/2021 DOS: the patient was seen and examined on 06/13/2021 PCP: Pcp, No  Patient coming from: via EMS  Chief Complaint:  Chief Complaint  Patient presents with   Seizures    HPI: Laura Sharp is a 21 y.o. female with medical history significant of known primary brain tumor diagnosed 04/2021 in [place], but had declined surgery at that time and wanted to seek seek a second opinion in [place].  She presents after having witnessed seizure at home and subsequently in route with EMS.  History is obtained from the patient's mother and review of records who was present at bedside as she is currently postictal.  Her mother notes that she is not sure if the patient suffered taking the medications in the first place, but it it was documented that she stopped taking them a few days ago due to the way it was making her feel.  Her mother noticed she had been having some personality changes over the last couple months.  Prior to the brain tumor being diagnosed she had been having chronic headaches for 4-5 months taking over-the-counter medications with temporary relief.  The patient had reportedly had episode of nausea and vomiting this morning prior to the seizure.  She had a witnessed seizure by her boyfriend around 12:40pm.  It was reported that patient had twitching of mouth and subsequently having shaking all over.  Since being in the ED patient was noted to have aphasia and some weakness on the right side .   CT imaging demonstrates large at least 5 cm diameter left frontal tumor with extensive regional edema edema and mass-effect with left-to-right shift of at least 12 mm.   Vital signs are otherwise noted to be stable.  Labs significant for WBC 15 sodium 131, CO2 16, and glucose 142.  Review of Systems: unable to review all systems due to the inability of the patient to answer questions. Past Medical  History:  Diagnosis Date   Allergy    Past Surgical History:  Procedure Laterality Date   HERNIA REPAIR     Social History:  reports that she has been smoking. She has never used smokeless tobacco. She reports that she does not currently use alcohol. She reports that she does not currently use drugs.  Allergies  Allergen Reactions   Penicillins     Family History  Family history unknown: Yes    Prior to Admission medications   Medication Sig Start Date End Date Taking? Authorizing Provider  cyclobenzaprine (FLEXERIL) 5 MG tablet Take 1-2 tablets (5-10 mg total) by mouth 2 (two) times daily as needed for muscle spasms. 03/13/20   Wieters, Hallie C, PA-C  doxycycline (VIBRAMYCIN) 100 MG capsule Take 1 capsule (100 mg total) by mouth 2 (two) times daily. 12/30/20   Muthersbaugh, Jarrett Soho, PA-C  fluticasone (FLONASE) 50 MCG/ACT nasal spray Place into both nostrils daily.    [provider]  ibuprofen (ADVIL) 800 MG tablet Take 1 tablet (800 mg total) by mouth 3 (three) times daily. 03/13/20   Janith Lima, PA-C    Physical Exam: Vitals:   06/13/21 1545 06/13/21 1600 06/13/21 1615 06/13/21 1645  BP: 125/72 119/65 130/71 125/81  Pulse: (!) 59 61 (!) 59 (!) 56  Resp: 15 16 17 17   SpO2: 99% 100% 100% 100%   Exam  Constitutional: NAD, calm, comfortable Eyes: PERRL, lids and conjunctivae normal ENMT: Mucous membranes are moist. Posterior pharynx clear  of any exudate or lesions.Normal dentition.  Neck: normal, supple, no masses, no thyromegaly Respiratory: clear to auscultation bilaterally, no wheezing, no crackles. Normal respiratory effort. No accessory muscle use.  Cardiovascular: Regular rate and rhythm, no murmurs / rubs / gallops. No extremity edema. 2+ pedal pulses. No carotid bruits.  Abdomen: no tenderness, no masses palpated. No hepatosplenomegaly. Bowel sounds positive.  Musculoskeletal: no clubbing / cyanosis. No joint deformity upper and lower extremities. Good  ROM, no contractures. Normal muscle tone.  Skin: no rashes, lesions, ulcers. No induration Neurologic: CN 2-12 grossly intact. Sensation intact, DTR normal. Strength 5/5 in all 4.  Psychiatric:   Data Reviewed: Labs significant for leukocytosis WBC elevated at 15 and sodium 131.  CT of the head reviewed noting large left frontal mass with surrounding edema with mass-effect  Assessment and Plan: * Seizure [facility]) Patient presents after having 2 seizures prior to arrival that resolved on their own.  She had been loaded with Keppra 1 g IV.  Previously she had been on Keppra 500 mg twice daily. -Admit to progressive bed -Continuous pulse oximetry with nasal cannula oxygen maintain O2 saturation greater than 92% -Seizure precautions -Neuro checks -N.p.o. and advance diet as tolerated -Check EEG -Keppra 500 mg IV twice daily.  Transition to p.o. when medically appropriate  Brain mass with mass-effect Patient had previously been diagnosed with a primary left-sided brain tumor in 04/2021 while living in [place] but had declined treatment surgery at that time.  CT imaging demonstrates large at least 5 cm diameter left frontal tumor with extensive regional edema edema and mass-effect with left-to-right shift of at least 12 mm.  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  Neurosurgery have been formally consulted.  Patient having given Decadron 10 mg IV.  There was concern for patient's ability to protect airway and MRI for which it was not initially obtained. -Obtain MRI of the brain with and without contrast -Continue Decadron 4 mg IV every 8 hours.  Transition to p.o. when medically appropriate -Appreciate neurosurgery consultative services, we will follow-up for any further recommendations  Leukocytosis- (present on admission) WBC elevated at 15.  Patient had recently been treated for trichomonas and bacterial vaginosis infection in a office visit on 01/30.  Suspect reactive in nature. -Check  urinalysis -Recheck CBC tomorrow morning  Hyponatremia- (present on admission) Acute.  On admission sodium 131. -On normal saline IV fluids at 75 mL/h -Recheck sodium levels in a.m.      Advance Care Planning:   Code Status: Full Code   Consults: Neurosurgery  Family Communication: Mother updated at bedside  Severity of Illness: The appropriate patient status for this patient is INPATIENT. Inpatient status is judged to be reasonable and necessary in order to provide the required intensity of service to ensure the patient's safety. The patient's presenting symptoms, physical exam findings, and initial radiographic and laboratory data in the context of their chronic comorbidities is felt to place them at high risk for further clinical deterioration. Furthermore, it is not anticipated that the patient will be medically stable for discharge from the hospital within 2 midnights of admission.   * I certify that at the point of admission it is my clinical judgment that the patient will require inpatient hospital care spanning beyond 2 midnights from the point of admission due to high intensity of service, high risk for further deterioration and high frequency of surveillance required.*  Author: Norval Morton, MD 06/13/2021 5:04 PM  For on call review [email or web address].

## 2021-06-13 NOTE — ED Notes (Signed)
MRI called this RN and notified me pt had 1 episode of emesis while over there. They report pt was unable to hold own emesis bag and they were concerned about laying her flat and her not being able to notify of nausea or vomiting. Therefore, scan not completed. MD Karle Starch aware. Pt returned to room, seated upright in bed, remains on monitor

## 2021-06-13 NOTE — Assessment & Plan Note (Addendum)
Likely reactive-no indication of infection-Per patient-she was on steroids to last week-so could be from steroids as well.  Afebrile.  Continue to follow periodically.

## 2021-06-13 NOTE — ED Notes (Signed)
Patient transported to MRI 

## 2021-06-13 NOTE — ED Provider Notes (Signed)
[facility] EMERGENCY DEPARTMENT Provider Note   CSN: [id number] Arrival date & time: 06/13/21  1348     History  Chief Complaint  Patient presents with   Seizures    Laura Sharp is a 21 y.o. female.  The history is provided by the patient and medical records. No language interpreter was used.  Seizures Seizure activity on arrival: no   Seizure type:  Grand mal Preceding symptoms comment:  Facial twitching Episode characteristics: generalized shaking   Context: intracranial lesion (reported tumor per chart)       Home Medications Prior to Admission medications   Medication Sig Start Date End Date Taking? Authorizing Provider  cyclobenzaprine (FLEXERIL) 5 MG tablet Take 1-2 tablets (5-10 mg total) by mouth 2 (two) times daily as needed for muscle spasms. 03/13/20   Wieters, Hallie C, PA-C  doxycycline (VIBRAMYCIN) 100 MG capsule Take 1 capsule (100 mg total) by mouth 2 (two) times daily. 12/30/20   Muthersbaugh, Jarrett Soho, PA-C  fluticasone (FLONASE) 50 MCG/ACT nasal spray Place into both nostrils daily.    [provider]  ibuprofen (ADVIL) 800 MG tablet Take 1 tablet (800 mg total) by mouth 3 (three) times daily. 03/13/20   Wieters, Hallie C, PA-C      Allergies    Penicillins    Review of Systems   Review of Systems  Unable to perform ROS: Acuity of condition (aphasia)  Constitutional:  Negative for chills, fatigue and fever.  Eyes:  Negative for visual disturbance.  Respiratory:  Negative for cough and shortness of breath.   Cardiovascular:  Negative for chest pain.  Gastrointestinal:  Negative for abdominal pain.  Musculoskeletal:  Negative for back pain.  Neurological:  Positive for seizures, speech difficulty and weakness. Negative for dizziness, light-headedness and headaches.  Psychiatric/Behavioral:  Negative for agitation.   All other systems reviewed and are negative.  Physical Exam Updated Vital Signs BP 119/69 (BP Location:  Right Arm)    Pulse 77    Resp (!) 22    SpO2 97%  Physical Exam Vitals and nursing note reviewed.  Constitutional:      General: She is not in acute distress.    Appearance: She is well-developed. She is not ill-appearing, toxic-appearing or diaphoretic.  HENT:     Head: Normocephalic and atraumatic.     Nose: No congestion.  Eyes:     Extraocular Movements: Extraocular movements intact.     Conjunctiva/sclera: Conjunctivae normal.     Pupils: Pupils are equal, round, and reactive to light.  Cardiovascular:     Rate and Rhythm: Normal rate and regular rhythm.     Heart sounds: No murmur heard. Pulmonary:     Effort: Pulmonary effort is normal. No respiratory distress.     Breath sounds: Normal breath sounds. No wheezing, rhonchi or rales.  Chest:     Chest wall: No tenderness.  Abdominal:     General: Abdomen is flat.     Palpations: Abdomen is soft.     Tenderness: There is no abdominal tenderness.  Musculoskeletal:        General: No swelling or tenderness.     Cervical back: Neck supple. No rigidity or tenderness.  Skin:    General: Skin is warm and dry.     Capillary Refill: Capillary refill takes less than 2 seconds.     Findings: No erythema.  Neurological:     Mental Status: She is alert.     Motor: Weakness present.  Psychiatric:  Mood and Affect: Mood normal.    ED Results / Procedures / Treatments   Labs (all labs ordered are listed, but only abnormal results are displayed) Labs Reviewed  CBC WITH DIFFERENTIAL/PLATELET - Abnormal; Notable for the following components:      Result Value   WBC 15.0 (*)    Neutro Abs 13.2 (*)    Abs Immature Granulocytes 0.15 (*)    All other components within normal limits  CBG MONITORING, ED - Abnormal; Notable for the following components:   Glucose-Capillary 142 (*)    All other components within normal limits  RESP PANEL BY RT-PCR (FLU A&B, COVID) ARPGX2  PROTIME-INR  COMPREHENSIVE METABOLIC PANEL  PREGNANCY,  URINE  I-STAT BETA HCG BLOOD, ED ([facility], [facility], [facility] ONLY)    EKG None  Radiology No results found.  Procedures Procedures    Medications Ordered in ED Medications  dexamethasone (DECADRON) injection 10 mg (has no administration in time range)  levETIRAcetam (KEPPRA) IVPB 1000 mg/100 mL premix (0 mg Intravenous Stopped 06/13/21 1448)    ED Course/ Medical Decision Making/ A&P                           Medical Decision Making Amount and/or Complexity of Data Reviewed Labs: ordered. Radiology: ordered.  Risk Prescription drug management.   Laura Sharp is a 21 y.o. female with a past medical history for recently diagnosed brain mass on Keppra who reportedly recently stopped taking her Keppra several days ago who presents for seizures and neurologic deficit.  According to patient family and chart review of outside system, patient was found to have abnormal CT head back in December and had MRI showing concern for a left-sided frontal mass.  Recommendations appear to have been to have neurosurgical intervention however it appears patient was looking for a second opinion and to try other possible therapies.  She appears to been on steroids and Keppra and was going to follow-up with outpatient neurosurgery.  According to family, they had not spoken to her seen her in a few days but reportedly she did not take her seizure medication.  Per family, she had a seizure this morning that was grand mall and then with EMS had shaking all over that was preceded by mouth twitching.  Patient had seizure resolution without any medications and CBG was not low.  On my arrival, airway appears to be intact however patient has what appears to be neurologic deficits on the right side.  She has aphasia, right facial droop, and right leg and right arm weakness.  She denies any sensory abnormality.  She is denying any pain currently with no headache or neck pain.  Lungs were clear and chest was nontender.  Abdomen  nontender.  She is denying any other complaints.  Pupils are symmetric and reactive with normal extraocular movements.  Given patient's 2 seizures today, known brain tumor, and her new symptoms, I am concerned that she could have had stroke, bleed, or worsened edema from this tumor.  Will order CT head initially to look for bleeding but anticipate she will need MRI brain with and without contrast to further evaluate.  We will give some Decadron to help with suspected cerebral edema and give her Keppra IV to load her.  We will get screening labs.  The right side deficits could be Todd's paralysis after seizure however I am concerned it could be tumor related.  Anticipate speaking with either neurology or neurosurgery  based on the results of the imaging and with her new neurologic deficits with unclear time of onset, I do anticipate she will need admission.  Care transferred to oncoming team while waiting for results to be completed.         Final Clinical Impression(s) / ED Diagnoses Final diagnoses:  Seizure ([place])  Right sided weakness  Aphasia     Clinical Impression: 1. Seizure ([place])   2. Right sided weakness   3. Aphasia     Disposition: Admit  This note was prepared with assistance of Dragon voice recognition software. Occasional wrong-word or sound-a-like substitutions may have occurred due to the inherent limitations of voice recognition software.     Fawna Cranmer, Gwenyth Allegra, MD 06/13/21 [phone]

## 2021-06-13 NOTE — Plan of Care (Signed)
°  Problem: Activity: °Goal: Risk for activity intolerance will decrease °Outcome: Progressing °  °Problem: Elimination: °Goal: Will not experience complications related to urinary retention °Outcome: Progressing °  °Problem: Pain Managment: °Goal: General experience of comfort will improve °Outcome: Progressing °  °

## 2021-06-13 NOTE — Progress Notes (Signed)
Patient admitted from ED.following simple instructions but non verbal.Vitals within normal limits. On RA

## 2021-06-13 NOTE — Progress Notes (Signed)
Patient is a 21 year old female with a recently diagnosed left-sided primary brain tumor.  Patient has previously refused treatment and interventions.  She presents now after generalized seizure activity.  Patient been noncompliant with her antiepileptic medications.  CT scan demonstrates a large intrinsic left frontal tumor with significant surrounding edema and mass-effect.  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  She is previously made her wishes known that she did not wish to proceed with surgical intervention.  The patient is postictal at present.  Prior to work-up for possible further intervention or surgery I would need the patient to clearly state that these are her wishes.  Without in mind I recommend medical admission with treatment with antiepileptic medications and Decadron.

## 2021-06-13 NOTE — ED Triage Notes (Signed)
Pt arrives via EMS for seizure with no history. Pt has history of brain tumor and was placed on keppra. Pt stopped taking keppra a few days ago due to not liking how it made her feel. Pt's boyfriend called EMS for pt having grand mal seizure around 12:40 today. Pt was post ictal and unresponsive for EMS. Pt had another seizure for EMS en route. No meds given. 98% on room air. CBG 150, HR 70s,140/90.

## 2021-06-13 NOTE — Assessment & Plan Note (Addendum)
Likely due to underlying brain mass-seizures have stabilized with Keppra.  Patient prefers to get most of her care at [facility]-family at bedside-requesting discharge so they can go to [facility] today.  Patient aware of driving restrictions.

## 2021-06-13 NOTE — Assessment & Plan Note (Addendum)
Resolved with IVF 

## 2021-06-13 NOTE — ED Notes (Signed)
Pt w/ another episode of vomiting. Zofran IV given, gown changed. Pt remains seated up in bed for aspiration precautions.

## 2021-06-13 NOTE — Assessment & Plan Note (Addendum)
Patient had previously been diagnosed with a primary left-sided brain tumor in 04/2021 while living in [place] but had declined treatment surgery at that time as she wanted to pursue surgery at [facility].  CT imaging demonstrates large at least 5 cm diameter left frontal tumor with extensive regional edema edema and mass-effect with left-to-right shift of at least 12 mm.  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  Neurosurgery have been formally consulted.  As noted above-patient/family at bedside-want to pursue further treatment at [facility].  They are requesting discharge today.  At patient's request earlier this morning-I had reached out to the [facility] transfer's desk to see if I could arrange a hospital to hospital transfer, however family/patient indicating that they will just probably drive down to [facility].

## 2021-06-14 DIAGNOSIS — R569 Unspecified convulsions: Secondary | ICD-10-CM | POA: Diagnosis not present

## 2021-06-14 DIAGNOSIS — E871 Hypo-osmolality and hyponatremia: Secondary | ICD-10-CM | POA: Diagnosis not present

## 2021-06-14 DIAGNOSIS — G9389 Other specified disorders of brain: Secondary | ICD-10-CM | POA: Diagnosis not present

## 2021-06-14 DIAGNOSIS — Z331 Pregnant state, incidental: Secondary | ICD-10-CM

## 2021-06-14 DIAGNOSIS — D72829 Elevated white blood cell count, unspecified: Secondary | ICD-10-CM | POA: Diagnosis not present

## 2021-06-14 LAB — COMPREHENSIVE METABOLIC PANEL
ALT: 16 U/L (ref 0–44)
AST: 11 U/L — ABNORMAL LOW (ref 15–41)
Albumin: 3.9 g/dL (ref 3.5–5.0)
Alkaline Phosphatase: 39 U/L (ref 38–126)
Anion gap: 12 (ref 5–15)
BUN: 5 mg/dL — ABNORMAL LOW (ref 6–20)
CO2: 20 mmol/L — ABNORMAL LOW (ref 22–32)
Calcium: 9.4 mg/dL (ref 8.9–10.3)
Chloride: 104 mmol/L (ref 98–111)
Creatinine, Ser: 0.62 mg/dL (ref 0.44–1.00)
GFR, Estimated: >60 mL/min (ref >60)
Glucose, Bld: 120 mg/dL — ABNORMAL HIGH (ref 70–99)
Potassium: 4 mmol/L (ref 3.5–5.1)
Sodium: 136 mmol/L (ref 135–145)
Total Bilirubin: 0.9 mg/dL (ref 0.3–1.2)
Total Protein: 6.7 g/dL (ref 6.5–8.1)

## 2021-06-14 LAB — CBC
HCT: 38.6 % (ref 36.0–46.0)
Hemoglobin: 13.2 g/dL (ref 12.0–15.0)
MCH: 33.1 pg (ref 26.0–34.0)
MCHC: 34.2 g/dL (ref 30.0–36.0)
MCV: 96.7 fL (ref 80.0–100.0)
Platelets: 295 10*3/uL (ref 150–400)
RBC: 3.99 MIL/uL (ref 3.87–5.11)
RDW: 11.9 % (ref 11.5–15.5)
WBC: 18.2 10*3/uL — ABNORMAL HIGH (ref 4.0–10.5)
nRBC: 0 % (ref 0.0–0.2)

## 2021-06-14 LAB — URINALYSIS, ROUTINE W REFLEX MICROSCOPIC
Bilirubin Urine: NEGATIVE
Glucose, UA: NEGATIVE mg/dL
Hgb urine dipstick: NEGATIVE
Ketones, ur: NEGATIVE mg/dL
Leukocytes,Ua: NEGATIVE
Nitrite: NEGATIVE
Protein, ur: NEGATIVE mg/dL
Specific Gravity, Urine: 1.01 (ref 1.005–1.030)
pH: 5.5 (ref 5.0–8.0)

## 2021-06-14 LAB — PREGNANCY, URINE: Preg Test, Ur: POSITIVE — AB

## 2021-06-14 MED ORDER — NEONATAL VITAMIN 27-0.8 MG PO TABS: 1.0000 | ORAL_TABLET | Freq: Every day | ORAL | 3 refills | Status: AC

## 2021-06-14 MED ORDER — DEXAMETHASONE 4 MG PO TABS: 4.0000 mg | ORAL_TABLET | Freq: Three times a day (TID) | ORAL | 3 refills | Status: DC

## 2021-06-14 MED ORDER — LEVETIRACETAM 500 MG PO TABS: 500.0000 mg | ORAL_TABLET | Freq: Two times a day (BID) | ORAL | 3 refills | Status: DC

## 2021-06-14 MED ORDER — FOLIC ACID 1 MG PO TABS: 4.0000 mg | ORAL_TABLET | Freq: Every day | ORAL | 3 refills | Status: AC

## 2021-06-14 NOTE — Progress Notes (Signed)
Nsg Discharge Note  Admit Date:  06/13/2021 Discharge date: 06/14/2021   Laura Sharp to be D/C'd Home per MD order.  AVS completed.  Patient/caregiver able to verbalize understanding.  Discharge Medication: Allergies as of 06/14/2021       Reactions   Penicillins Other (See Comments)   Maternal allergy        Medication List     STOP taking these medications    cyclobenzaprine 5 MG tablet Commonly known as: FLEXERIL   doxycycline 100 MG capsule Commonly known as: VIBRAMYCIN   fluticasone 50 MCG/ACT nasal spray Commonly known as: FLONASE   ibuprofen 800 MG tablet Commonly known as: ADVIL       TAKE these medications    dexamethasone 4 MG tablet Commonly known as: DECADRON Take 1 tablet (4 mg total) by mouth 3 (three) times daily.   folic acid 1 MG tablet Commonly known as: FOLVITE Take 4 tablets (4 mg total) by mouth daily.   levETIRAcetam 500 MG tablet Commonly known as: Keppra Take 1 tablet (500 mg total) by mouth 2 (two) times daily.   NeoNatal Vitamin 27-0.8 MG Tabs Take 1 tablet by mouth daily at 8 pm.        Discharge Assessment: Vitals:   06/14/21 0400 06/14/21 0757  BP: 101/65 105/72  Pulse: 60 62  Resp: 16 19  Temp: 98.9 F (37.2 C) 98.4 F (36.9 C)  SpO2: 100% 95%   Skin clean, dry and intact without evidence of skin break down, no evidence of skin tears noted. IV catheter discontinued intact. Site without signs and symptoms of complications - no redness or edema noted at insertion site, patient denies c/o pain - only slight tenderness at site.  Dressing with slight pressure applied.  D/c Instructions-Education: Discharge instructions given to patient/family with verbalized understanding. D/c education completed with patient/family including follow up instructions, medication list, d/c activities limitations if indicated, with other d/c instructions as indicated by MD - patient able to verbalize understanding, all questions fully  answered. Patient instructed to return to ED, call 911, or call MD for any changes in condition.  Patient escorted via [place], and D/C home via private auto.  Dannie Hattabaugh, Jolene Schimke, RN 06/14/2021 11:36 AM

## 2021-06-14 NOTE — Progress Notes (Signed)
Patient significant other at bedside, asking primary nurse about when [facility] neurosurgery would see the patient. Primary nurse asked for clarification regarding statement. Significant other states that he told "EMS, and everyone I have seen" that they want [facility] to evaluate patient and they are only here because she had a seizure. He would like patient to be transferred to [facility].

## 2021-06-14 NOTE — Plan of Care (Signed)
°  Problem: Health Behavior/Discharge Planning: Goal: Ability to manage health-related needs will improve Outcome: discharged

## 2021-06-14 NOTE — Hospital Course (Signed)
Patient is a 20 y.o.  female primary brain tumor diagnosed in December 2022 in [place], [place] (per H&P-refused surgery)-presenting with breakthrough seizures.  Significant Hospital events: 2/4>> admit to [facility] for seizure  Significant imaging studies: 2/4>> CT head: 5 cm malignant appearing mass in the left frontal operculum with edema, mass-effect  Significant microbiology data: 2/4>> COVID/flu PCR: Negative  Procedures: None  Consults:  Neurosurgery

## 2021-06-14 NOTE — Assessment & Plan Note (Addendum)
Urine pregnancy test noted to be positive earlier this morning, patient's last menstrual cycle was on 05/17/2021.  Discussed with neurology (Dr Leander Rams to continue Keppra and Decadron.  Long discussion with patient-and subsequently with boyfriend/parents (after patient gave me permission-witnessed by bedside RN Dorothy)-all understand that if she is pregnant it is early-and needs further work-up-including OB/GYN consultation at some point in time in the near future.  We will place her on neonatal vitamins.

## 2021-06-14 NOTE — Discharge Summary (Signed)
PATIENT DETAILS Name: Laura Sharp Age: 21 y.o. Sex: female Date of Birth: Feb 07, 2001 MRN: [id number]. Admitting Physician: Norval Morton, MD PCP:Pcp, No  Admit Date: 06/13/2021 Discharge date: 06/14/2021  Recommendations for Outpatient Follow-up:  Follow up with PCP in 1-2 weeks Please obtain CMP/CBC in one week Likely pregnant-we will need further work-up/evaluation including OB/GYN in the near future.  Admitted From:  Home   Disposition: Home   Discharge Condition: good  CODE STATUS:   Code Status: Full Code   Diet recommendation:  Diet Order             Diet general           Diet regular Room service appropriate? Yes; Fluid consistency: Thin  Diet effective now                    Brief Summary: Patient is a 21 y.o.  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  Significant Hospital events: 2/4>> admit to [facility] for seizure  Significant imaging studies: 2/4>> CT head: 5 cm malignant appearing mass in the left frontal operculum with edema, mass-effect  Significant microbiology data: 2/4>> COVID/flu PCR: Negative  Procedures: None  Consults:  Neurosurgery  Brief Hospital Course: * Seizure [facility]) Likely due to underlying brain mass-seizures have stabilized with Keppra.  Patient prefers to get most of her care at [facility]-family at bedside-requesting discharge so they can go to [facility] today.  Patient aware of driving restrictions.  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  CT imaging demonstrates large at least 5 cm diameter left frontal tumor with extensive regional edema edema and mass-effect with left-to-right shift of at  least 12 mm.  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  Neurosurgery have been formally consulted.  As noted above-patient/family at bedside-want to pursue further treatment at [facility].  They are requesting discharge today.  At patient's request earlier this morning-I had reached out to the [facility] transfer's desk to see if I could arrange a hospital to hospital transfer, however family/patient indicating that they will just probably drive down to [facility].   Hyponatremia- (present on admission) Resolved with IVF  Pregnancy as incidental finding Urine pregnancy test noted to be positive earlier this morning, patient's last menstrual cycle was on 05/17/2021.  Discussed with neurology (Dr Leander Rams to continue Keppra and Decadron.  Long discussion with patient-and subsequently with boyfriend/parents (after patient gave me permission-witnessed by bedside RN Dorothy)-all understand that if she is pregnant it is early-and needs further work-up-including OB/GYN consultation at some point in time in the near future.  We will place her on neonatal vitamins.  Leukocytosis- (present on admission) Likely reactive-no indication of infection-Per patient-she was on steroids to last week-so could be from steroids as well.  Afebrile.  Continue to follow periodically.   BMI: Estimated body mass index is 29.83 kg/m as calculated from the following:   Height as of 12/30/20: 5\' 7"  (1.702 m).   Weight as of this encounter: 86.4 kg.    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   Hyponatremia   Leukocytosis   Pregnancy as incidental finding   Discharge Instructions:  Activity:  As tolerated    Discharge Instructions     Call MD for:  extreme fatigue   Complete by: As directed    Call MD for:  persistant dizziness or light-headedness   Complete by: As directed    Diet general   Complete by: As directed    Discharge instructions    Complete by: As directed    Follow with Primary MD  in 1-2 weeks  Please get a complete blood count and chemistry panel checked by your Primary MD at your next visit, and again as instructed by your Primary MD.  Get Medicines reviewed and adjusted: Please take all your medications with you for your next visit with your Primary MD  Laboratory/radiological data: Please request your Primary MD to go over all hospital tests and procedure/radiological results at the follow up, please ask your Primary MD to get all Hospital records sent to his/her office.  In some cases, they will be blood work, cultures and biopsy results pending at the time of your discharge. Please request that your primary care M.D. follows up on these results.  Also Note the following: If you experience worsening of your admission symptoms, develop shortness of breath, life threatening emergency, suicidal or homicidal thoughts you must seek medical attention immediately by calling 911 or calling your MD immediately  if symptoms less severe.  You must read complete instructions/literature along with all the possible adverse reactions/side effects for all the Medicines you take and that have been prescribed to you. Take any new Medicines after you have completely understood and accpet all the possible adverse reactions/side effects.   Do not drive when taking Pain medications or sleeping medications (Benzodaizepines)  Do not take more than prescribed Pain, Sleep and Anxiety Medications. It is not advisable to combine anxiety,sleep and pain medications without talking with your primary care practitioner  Special Instructions: If you have smoked or chewed Tobacco  in the last 2 yrs please stop smoking, stop any regular Alcohol  and or any Recreational drug use.  Wear Seat belts while driving.  Please note: You were cared for by a hospitalist during your hospital stay. Once you are discharged, your primary care physician will  handle any further medical issues. Please note that NO REFILLS for any discharge medications will be authorized once you are discharged, as it is imperative that you return to your primary care physician (or establish a relationship with a primary care physician if you do not have one) for your post hospital discharge needs so that they can reassess your need for medications and monitor your lab values.   Seizure precautions: Per [facility] statutes, patients with seizures are not allowed to drive until they have been seizure-free for six months and cleared by a physician    Use caution when using heavy equipment or power tools. Avoid working on ladders or at heights. Take showers instead of baths. Ensure the water temperature is not too high on the home water heater. Do not go swimming alone. Do not lock yourself in a room alone (i.e. bathroom). When caring for infants or small children, sit down when holding, feeding, or changing them to minimize risk of injury to the child in the event you have a seizure. Maintain good sleep hygiene. Avoid alcohol.    If patient has another seizure, call 911 and bring them back to the ED if: A.  The seizure lasts longer than 5 minutes.      B.  The patient doesn't wake shortly after the seizure or has new problems such as difficulty seeing, speaking or moving following the seizure C.  The patient was injured during the seizure D.  The patient has a temperature over 102 F (39C) E.  The patient vomited during the seizure and now is having trouble breathing    During the Seizure   - First, ensure adequate ventilation and place patients on the floor on their left side  Loosen clothing around the neck and ensure the airway is patent. If the patient is clenching the teeth, do not force the mouth open with any object as this can cause severe damage - Remove all items from the surrounding that can be hazardous. The patient may be oblivious to what's happening and  may not even know what he or she is doing. If the patient is confused and wandering, either gently guide him/her away and block access to outside areas - Reassure the individual and be comforting - Call 911. In most cases, the seizure ends before EMS arrives. However, there are cases when seizures may last over 3 to 5 minutes. Or the individual may have developed breathing difficulties or severe injuries. If a pregnant patient or a person with diabetes develops a seizure, it is prudent to call an ambulance. - Finally, if the patient does not regain full consciousness, then call EMS. Most patients will remain confused for about 45 to 90 minutes after a seizure, so you must use judgment in calling for help. - Avoid restraints but make sure the patient is in a bed with padded side rails - Place the individual in a lateral position with the neck slightly flexed; this will help the saliva drain from the mouth and prevent the tongue from falling backward - Remove all nearby furniture and other hazards from the area - Provide verbal assurance as the individual is regaining consciousness - Provide the patient with privacy if possible - Call for help and start treatment as ordered by the caregiver    After the Seizure (Postictal Stage)   After a seizure, most patients experience confusion, fatigue, muscle pain and/or a headache. Thus, one should permit the individual to sleep. For the next few days, reassurance is essential. Being calm and helping reorient the person is also of importance.   Most seizures are painless and end spontaneously. Seizures are not harmful to others but can lead to complications such as stress on the lungs, brain and the heart. Individuals with prior lung problems may develop labored breathing and respiratory distress.   Increase activity slowly   Complete by: As directed       Allergies as of 06/14/2021       Reactions   Penicillins Other (See Comments)   Maternal allergy         Medication List     STOP taking these medications    cyclobenzaprine 5 MG tablet Commonly known as: FLEXERIL   doxycycline 100 MG capsule Commonly known as: VIBRAMYCIN   fluticasone 50 MCG/ACT nasal spray Commonly known as: FLONASE   ibuprofen 800 MG tablet Commonly known as: ADVIL       TAKE these medications    dexamethasone 4 MG tablet Commonly known as: DECADRON Take 1 tablet (4 mg total) by mouth 3 (three) times daily.   folic acid 1 MG tablet Commonly known as: FOLVITE Take 4 tablets (4 mg total) by mouth daily.   levETIRAcetam 500 MG tablet Commonly known as: Keppra Take 1  tablet (500 mg total) by mouth 2 (two) times daily.   NeoNatal Vitamin 27-0.8 MG Tabs Take 1 tablet by mouth daily at 8 pm.        Allergies  Allergen Reactions   Penicillins Other (See Comments)    Maternal allergy     Other Procedures/Studies: CT HEAD WO CONTRAST (5MM)  Result Date: 06/13/2021 CLINICAL DATA:  Acute presentation with seizure. History of brain tumor. Postictal and unresponsive. EXAM: CT HEAD WITHOUT CONTRAST TECHNIQUE: Contiguous axial images were obtained from the base of the skull through the vertex without intravenous contrast. RADIATION DOSE REDUCTION: This exam was performed according to the departmental dose-optimization program which includes automated exposure control, adjustment of the mA and/or kV according to patient size and/or use of iterative reconstruction technique. COMPARISON:  None. FINDINGS: Brain: Malignant-appearing at least 5 cm in diameter heterogeneous density mass with the epicenter in the left frontal operculum with extensive surrounding edema/regional edema and mass effect. Possible crossing of the midline via the corpus callosum into the right frontal region. Mass effect results in left-to-right midline shift of at least 12 mm. Ventricular asymmetry with the right being larger than the left probably due to the left-sided mass effect,  though an element of ventricular trapping is not excluded. No evidence of intracranial hemorrhage. No extra-axial fluid collection. Vascular: No abnormal vascular finding. Skull: Negative Sinuses/Orbits: Clear/normal Other: None IMPRESSION: At least 5 cm in diameter malignant-appearing mass with the epicenter in the left frontal operculum with extensive regional edema. Possible crossing of midline via the corpus callosum. 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 Mass effect with left-to-right shift of at least 12 mm. Ventricular asymmetry which could be due to the mass effect, though early trapping of the right lateral ventricle is not excluded. Electronically Signed   By: Nelson Chimes M.D.   On: 06/13/2021 16:00     TODAY-DAY OF DISCHARGE:  Subjective:   Laura Sharp today has no headache,no chest abdominal pain,no new weakness tingling or numbness, feels much better wants to go home today.   Objective:   Blood pressure 105/72, pulse 62, temperature 98.4 F (36.9 C), temperature source Oral, resp. rate 19, weight 86.4 kg, SpO2 95 %.  Intake/Output Summary (Last 24 hours) at 06/14/2021 1122 Last data filed at 06/14/2021 1007 Gross per 24 hour  Intake 1095.83 ml  Output 2050 ml  Net -954.17 ml   Filed Weights   06/14/21 0400  Weight: 86.4 kg    Exam: Awake Alert, Oriented *3, No new F.N deficits, Normal affect .AT,PERRAL Supple Neck,No JVD, No cervical lymphadenopathy appriciated.  Symmetrical Chest wall movement, Good air movement bilaterally, CTAB RRR,No Gallops,Rubs or new Murmurs, No Parasternal Heave +ve B.Sounds, Abd Soft, Non tender, No organomegaly appriciated, No rebound -guarding or rigidity. No Cyanosis, Clubbing or edema, No new Rash or bruise   PERTINENT RADIOLOGIC STUDIES: CT HEAD WO CONTRAST (5MM)  Result Date: 06/13/2021 CLINICAL DATA:  Acute presentation with seizure. History of brain tumor. Postictal and unresponsive. EXAM: CT HEAD WITHOUT CONTRAST TECHNIQUE:  Contiguous axial images were obtained from the base of the skull through the vertex without intravenous contrast. RADIATION DOSE REDUCTION: This exam was performed according to the departmental dose-optimization program which includes automated exposure control, adjustment of the mA and/or kV according to patient size and/or use of iterative reconstruction technique. COMPARISON:  None. FINDINGS: Brain: Malignant-appearing at least 5 cm in diameter heterogeneous density mass with the epicenter in the left frontal operculum with extensive surrounding edema/regional edema and mass effect.  Possible crossing of the midline via the corpus callosum into the right frontal region. Mass effect results in left-to-right midline shift of at least 12 mm. Ventricular asymmetry with the right being larger than the left probably due to the left-sided mass effect, though an element of ventricular trapping is not excluded. No evidence of intracranial hemorrhage. No extra-axial fluid collection. Vascular: No abnormal vascular finding. Skull: Negative Sinuses/Orbits: Clear/normal Other: None IMPRESSION: At least 5 cm in diameter malignant-appearing mass with the epicenter in the left frontal operculum with extensive regional edema. Possible crossing of midline via the corpus callosum. 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 Mass effect with left-to-right shift of at least 12 mm. Ventricular asymmetry which could be due to the mass effect, though early trapping of the right lateral ventricle is not excluded. Electronically Signed   By: Nelson Chimes M.D.   On: 06/13/2021 16:00     PERTINENT LAB RESULTS: CBC: Recent Labs    06/13/21 1425 06/14/21 0141  WBC 15.0* 18.2*  HGB 13.6 13.2  HCT 39.3 38.6  PLT 302 295   CMET CMP     Component Value Date/Time   NA 136 06/14/2021 0141   K 4.0 06/14/2021 0141   CL 104 06/14/2021 0141   CO2 20 (L) 06/14/2021 0141   GLUCOSE 120 (H) 06/14/2021 0141   BUN 5 (L) 06/14/2021 0141    CREATININE 0.62 06/14/2021 0141   CALCIUM 9.4 06/14/2021 0141   PROT 6.7 06/14/2021 0141   ALBUMIN 3.9 06/14/2021 0141   AST 11 (L) 06/14/2021 0141   ALT 16 06/14/2021 0141   ALKPHOS 39 06/14/2021 0141   BILITOT 0.9 06/14/2021 0141   GFRNONAA >60 06/14/2021 0141    GFR Estimated Creatinine Clearance: 124.5 mL/min (by C-G formula based on SCr of 0.62 mg/dL). No results for input(s): LIPASE, AMYLASE in the last 72 hours. No results for input(s): CKTOTAL, CKMB, CKMBINDEX, TROPONINI in the last 72 hours. Invalid input(s): POCBNP No results for input(s): DDIMER in the last 72 hours. No results for input(s): HGBA1C in the last 72 hours. No results for input(s): CHOL, HDL, LDLCALC, TRIG, CHOLHDL, LDLDIRECT in the last 72 hours. No results for input(s): TSH, T4TOTAL, T3FREE, THYROIDAB in the last 72 hours.  Invalid input(s): FREET3 No results for input(s): VITAMINB12, FOLATE, FERRITIN, TIBC, IRON, RETICCTPCT in the last 72 hours. Coags: Recent Labs    06/13/21 1425  INR 1.1   Microbiology: Recent Results (from the past 240 hour(s))  Resp Panel by RT-PCR (Flu A&B, Covid) Nasopharyngeal Swab     Status: None   Collection Time: 06/13/21  2:33 PM   Specimen: Nasopharyngeal Swab; Nasopharyngeal(NP) swabs in vial transport medium  Result Value Ref Range Status   SARS Coronavirus 2 by RT PCR NEGATIVE NEGATIVE Final    Comment: (NOTE) SARS-CoV-2 target nucleic acids are NOT DETECTED.  The SARS-CoV-2 RNA is generally detectable in upper respiratory specimens during the acute phase of infection. The lowest concentration of SARS-CoV-2 viral copies this assay can detect is 138 copies/mL. A negative result does not preclude SARS-Cov-2 infection and should not be used as the sole basis for treatment or other patient management decisions. A negative result may occur with  improper specimen collection/handling, submission of specimen other than nasopharyngeal swab, presence of viral mutation(s)  within the areas targeted by this assay, and inadequate number of viral copies(<138 copies/mL). A negative result must be combined with clinical observations, patient history, and epidemiological information. The expected result is Negative.  Fact Sheet for  Patients:  EntrepreneurPulse.com.au  Fact Sheet for Healthcare Providers:  IncredibleEmployment.be  This test is no t yet approved or cleared by the [place] FDA and  has been authorized for detection and/or diagnosis of SARS-CoV-2 by FDA under an Emergency Use Authorization (EUA). This EUA will remain  in effect (meaning this test can be used) for the duration of the COVID-19 declaration under Section 564(b)(1) of the Act, 21 U.S.C.section 360bbb-3(b)(1), unless the authorization is terminated  or revoked sooner.       Influenza A by PCR NEGATIVE NEGATIVE Final   Influenza B by PCR NEGATIVE NEGATIVE Final    Comment: (NOTE) The Xpert Xpress SARS-CoV-2/FLU/RSV plus assay is intended as an aid in the diagnosis of influenza from Nasopharyngeal swab specimens and should not be used as a sole basis for treatment. Nasal washings and aspirates are unacceptable for Xpert Xpress SARS-CoV-2/FLU/RSV testing.  Fact Sheet for Patients: EntrepreneurPulse.com.au  Fact Sheet for Healthcare Providers: IncredibleEmployment.be  This test is not yet approved or cleared by the [place] FDA and has been authorized for detection and/or diagnosis of SARS-CoV-2 by FDA under an Emergency Use Authorization (EUA). This EUA will remain in effect (meaning this test can be used) for the duration of the COVID-19 declaration under Section 564(b)(1) of the Act, 21 U.S.C. section 360bbb-3(b)(1), unless the authorization is terminated or revoked.  Performed at [facility], [place] [address] [place], [place] [address]     FURTHER DISCHARGE INSTRUCTIONS:  Get  Medicines reviewed and adjusted: Please take all your medications with you for your next visit with your Primary MD  Laboratory/radiological data: Please request your Primary MD to go over all hospital tests and procedure/radiological results at the follow up, please ask your Primary MD to get all Hospital records sent to his/her office.  In some cases, they will be blood work, cultures and biopsy results pending at the time of your discharge. Please request that your primary care M.D. goes through all the records of your hospital data and follows up on these results.  Also Note the following: If you experience worsening of your admission symptoms, develop shortness of breath, life threatening emergency, suicidal or homicidal thoughts you must seek medical attention immediately by calling 911 or calling your MD immediately  if symptoms less severe.  You must read complete instructions/literature along with all the possible adverse reactions/side effects for all the Medicines you take and that have been prescribed to you. Take any new Medicines after you have completely understood and accpet all the possible adverse reactions/side effects.   Do not drive when taking Pain medications or sleeping medications (Benzodaizepines)  Do not take more than prescribed Pain, Sleep and Anxiety Medications. It is not advisable to combine anxiety,sleep and pain medications without talking with your primary care practitioner  Special Instructions: If you have smoked or chewed Tobacco  in the last 2 yrs please stop smoking, stop any regular Alcohol  and or any Recreational drug use.  Wear Seat belts while driving.  Please note: You were cared for by a hospitalist during your hospital stay. Once you are discharged, your primary care physician will handle any further medical issues. Please note that NO REFILLS for any discharge medications will be authorized once you are discharged, as it is imperative that you  return to your primary care physician (or establish a relationship with a primary care physician if you do not have one) for your post hospital discharge needs so that they can  reassess your need for medications and monitor your lab values.  Total Time spent coordinating discharge including counseling, education and face to face time equals greater than 30 minutes.  Signed: Dalyla Chui 06/14/2021 11:22 AM

## 2021-06-15 DIAGNOSIS — C719 Malignant neoplasm of brain, unspecified: Secondary | ICD-10-CM

## 2021-06-15 HISTORY — DX: Malignant neoplasm of brain, unspecified: C71.9

## 2021-07-03 ENCOUNTER — Other Ambulatory Visit: Payer: Self-pay | Admitting: Radiation Therapy

## 2021-07-03 ENCOUNTER — Ambulatory Visit
Admission: RE | Admit: 2021-07-03 | Discharge: 2021-07-03 | Disposition: A | Discharge status: Home / Self Care | Payer: Self-pay | Source: Ambulatory Visit | Attending: Internal Medicine | Admitting: Internal Medicine

## 2021-07-03 ENCOUNTER — Other Ambulatory Visit: Payer: Self-pay | Admitting: Internal Medicine

## 2021-07-03 DIAGNOSIS — C719 Malignant neoplasm of brain, unspecified: Secondary | ICD-10-CM

## 2021-07-06 ENCOUNTER — Inpatient Hospital Stay: Payer: Managed Care, Other (non HMO) | Attending: Internal Medicine

## 2021-07-07 ENCOUNTER — Telehealth: Payer: Self-pay | Admitting: Internal Medicine

## 2021-07-07 NOTE — Telephone Encounter (Signed)
Scheduled appt per 2/23 referral. Pt is aware of appt date and time. Pt is aware to arrive 15 mins prior to appt time and to bring and updated insurance card. Pt is aware of appt location.   °

## 2021-07-13 ENCOUNTER — Inpatient Hospital Stay: Payer: Managed Care, Other (non HMO) | Attending: Internal Medicine | Admitting: Internal Medicine

## 2021-07-13 ENCOUNTER — Encounter: Payer: Self-pay | Admitting: Internal Medicine

## 2021-07-13 ENCOUNTER — Other Ambulatory Visit: Payer: Self-pay

## 2021-07-13 VITALS — BP 119/77 | HR 61 | Temp 98.0°F | Resp 18 | Wt 180.4 lb

## 2021-07-13 DIAGNOSIS — G893 Neoplasm related pain (acute) (chronic): Secondary | ICD-10-CM | POA: Insufficient documentation

## 2021-07-13 DIAGNOSIS — C719 Malignant neoplasm of brain, unspecified: Secondary | ICD-10-CM

## 2021-07-13 DIAGNOSIS — C711 Malignant neoplasm of frontal lobe: Secondary | ICD-10-CM | POA: Insufficient documentation

## 2021-07-13 DIAGNOSIS — Z7952 Long term (current) use of systemic steroids: Secondary | ICD-10-CM | POA: Diagnosis not present

## 2021-07-13 DIAGNOSIS — O99891 Other specified diseases and conditions complicating pregnancy: Secondary | ICD-10-CM | POA: Insufficient documentation

## 2021-07-13 DIAGNOSIS — G4089 Other seizures: Secondary | ICD-10-CM | POA: Diagnosis not present

## 2021-07-13 DIAGNOSIS — H4710 Unspecified papilledema: Secondary | ICD-10-CM | POA: Diagnosis not present

## 2021-07-13 DIAGNOSIS — R6889 Other general symptoms and signs: Secondary | ICD-10-CM | POA: Insufficient documentation

## 2021-07-13 DIAGNOSIS — R569 Unspecified convulsions: Secondary | ICD-10-CM | POA: Diagnosis not present

## 2021-07-13 DIAGNOSIS — Z79899 Other long term (current) drug therapy: Secondary | ICD-10-CM | POA: Insufficient documentation

## 2021-07-13 NOTE — Progress Notes (Signed)
[facility] [place], [place] [address] [phone]   New Patient Evaluation  Date of Service: 07/13/21 Patient Name: Laura Sharp Patient MRN: [id number] Patient DOB: 11/08/2000 Provider: Ventura Sellers, MD  Identifying Statement:  Laura Sharp is a 21 y.o. 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    Referring Provider: No referring provider defined for this encounter.  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 Referred to Neurology who put her on Diamox for papilledema.   05/07/2021 Radiology Study  MRI revealed large left frontal mass with enhancement and scattered small cystic foci. Referred to neurosurgery at [facility] and recommended to surgery.   06/13/2021 Clinical Event-Other  Presented to ER at [facility] with new onset witness generalized seizure. CT of the head revealed worsening cerebral edema with 12 mm midline shift and enlarging left frontal enhancing mass. She was also found to have a positive pregnancy test at this time.   Transferred to [place].  06/15/2021 Biopsy  Biopsy with Dr. Deetta Perla at [facility]. Pathology returned astrocytoma (WHO 3). See table below for biomarkers.   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 Deetta Perla at [facility]. Pathology  reveals astrocytoma (WHO 3). IDH mutated.   06/18/2021 Clinical Event-Other  Consult with OB/GYN. [place] and serial beta HCG values c/w developing pregnancy. Pt voiced desire to terminate pregnancy. [facility] to have this [facility] with [facility].   07/02/2021 Clinical Event-Other  New patient evaluation at the [place] at [facility]. Recommended she return to Neurosurgery for resection.   Biomarkers:  MGMT Unknown.  IDH 1/2 Mutated.  EGFR Unknown  1p/19q Intact   History of Present Illness: The patient's records from the referring physician were obtained and reviewed and the patient interviewed to confirm this HPI.  Laura Sharp presents today for local care for frontal astrocytoma, referred by her primary oncology team at [facility].  Patient is now 69w1dper LMP.  She and her husband describe ongoing decline in function, with decreased ability to express herself using words.  Much of history and discussion was through husband, Laura Sharp.  She does walk some on her own, but is relying mostly on a rolling walker or a wheelchair.  She is not independent with most activities of daily living, requiring full assistance and supervision from [place]  There is no other assistance locally, with family not nearby.  She has not been dosing Keppra or Decadron due to patient refusal.  Does not have either surgery or D+C scheduled at this time.  Medications: Current Outpatient Medications on File Prior to Visit  Medication Sig Dispense Refill   dexamethasone (DECADRON) 4 MG tablet Take 1 tablet (4 mg total) by mouth 3 (three) times daily. 90 tablet 3   folic acid (FOLVITE) 1 MG tablet Take 4 tablets (4 mg total) by mouth daily. 120 tablet 3   levETIRAcetam (KEPPRA)  500 MG tablet Take 1 tablet (500 mg total) by mouth 2 (two) times daily. 60 tablet 3   Prenatal Vit-Fe Fumarate-FA (NEONATAL VITAMIN) 27-0.8 MG TABS Take 1 tablet by mouth daily at 8 pm. 30 tablet 3   No current  facility-administered medications on file prior to visit.    Allergies:  Allergies  Allergen Reactions   Penicillins Other (See Comments)    Maternal allergy   Past Medical History:  Past Medical History:  Diagnosis Date   Allergy    Past Surgical History:  Past Surgical History:  Procedure Laterality Date   HERNIA REPAIR     Social History:  Social History   Socioeconomic History   Marital status: Single    Spouse name: Not on file   Number of children: Not on file   Years of education: Not on file   Highest education level: Not on file  Occupational History   Not on file  Tobacco Use   Smoking status: Some Days   Smokeless tobacco: Never  Substance and Sexual Activity   Alcohol use: Not Currently   Drug use: Not Currently   Sexual activity: Not on file  Other Topics Concern   Not on file  Social History Narrative   Not on file   Social Determinants of Health   Financial Resource Strain: Not on file  Food Insecurity: Not on file  Transportation Needs: Not on file  Physical Activity: Not on file  Stress: Not on file  Social Connections: Not on file  Intimate Partner Violence: Not on file   Family History:  Family History  Family history unknown: Yes    Review of Systems: Limited by dysphasia  Physical Exam: Vitals:   07/13/21 1435  BP: 119/77  Pulse: 61  Resp: 18  Temp: 98 F (36.7 C)  SpO2: 100%   KPS: 60. General: Alert, cooperative, pleasant, in no acute distress Head: Normal EENT: No conjunctival injection or scleral icterus.  Lungs: Resp effort normal Cardiac: Regular rate Abdomen: Non-distended abdomen Skin: No rashes cyanosis or petechiae. Extremities: No clubbing or edema  Neurologic Exam: Mental Status: Awake, alert. Grossly oriented to self and environment. Language is impaired with regards to fluency and comprehension.  Very sparse language output, poor insight into nature of disease process (points to head and says cancer,  but no other details provided).  Affect is consistent with abulia.  Cranial Nerves: Visual acuity is grossly normal. Visual fields are full. Extra-ocular movements intact. No ptosis. Face is symmetric Motor: Tone and bulk are normal. Power is 4/5 in right arm, 4+/5 in right leg. Reflexes are symmetric, no pathologic reflexes present.  Sensory: Intact to light touch Gait: Hemiparetic, dystaxic   Labs: I have reviewed the data as listed    Component Value Date/Time   NA 136 06/14/2021 0141   K 4.0 06/14/2021 0141   CL 104 06/14/2021 0141   CO2 20 (L) 06/14/2021 0141   GLUCOSE 120 (H) 06/14/2021 0141   BUN 5 (L) 06/14/2021 0141   CREATININE 0.62 06/14/2021 0141   CALCIUM 9.4 06/14/2021 0141   PROT 6.7 06/14/2021 0141   ALBUMIN 3.9 06/14/2021 0141   AST 11 (L) 06/14/2021 0141   ALT 16 06/14/2021 0141   ALKPHOS 39 06/14/2021 0141   BILITOT 0.9 06/14/2021 0141   GFRNONAA >60 06/14/2021 0141   Lab Results  Component Value Date   WBC 18.2 (H) 06/14/2021   NEUTROABS 13.2 (H) 06/13/2021   HGB 13.2 06/14/2021  HCT 38.6 06/14/2021   MCV 96.7 06/14/2021   PLT 295 06/14/2021    Imaging: CT HEAD WO CONTRAST (5MM)  Result Date: 06/13/2021 CLINICAL DATA:  Acute presentation with seizure. History of brain tumor. Postictal and unresponsive. EXAM: CT HEAD WITHOUT CONTRAST TECHNIQUE: Contiguous axial images were obtained from the base of the skull through the vertex without intravenous contrast. RADIATION DOSE REDUCTION: This exam was performed according to the departmental dose-optimization program which includes automated exposure control, adjustment of the mA and/or kV according to patient size and/or use of iterative reconstruction technique. COMPARISON:  None. FINDINGS: Brain: Malignant-appearing at least 5 cm in diameter heterogeneous density mass with the epicenter in the left frontal operculum with extensive surrounding edema/regional edema and mass effect. Possible crossing of the midline  via the corpus callosum into the right frontal region. Mass effect results in left-to-right midline shift of at least 12 mm. Ventricular asymmetry with the right being larger than the left probably due to the left-sided mass effect, though an element of ventricular trapping is not excluded. No evidence of intracranial hemorrhage. No extra-axial fluid collection. Vascular: No abnormal vascular finding. Skull: Negative Sinuses/Orbits: Clear/normal Other: None IMPRESSION: At least 5 cm in diameter malignant-appearing mass with the epicenter in the left frontal operculum with extensive regional edema. Possible crossing of midline via the corpus callosum. 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 Mass effect with left-to-right shift of at least 12 mm. Ventricular asymmetry which could be due to the mass effect, though early trapping of the right lateral ventricle is not excluded. Electronically Signed   By: Nelson Chimes M.D.   On: 06/13/2021 16:00      Pathology: A-B. Brain, left frontal, stereotactic biopsy:   Integrated diagnosis: Astrocytoma, IDH-mutant, CNS WHO grade 3 (pending CDKN2A testing), see comment. Histopathological classification: Astrocytoma. Molecular information: IDH1 R132H is positive in tumor cells, ATRX shows loss of nuclear expression in tumor cells, and p53 is positive in approximately 20% of tumor cells (immunohistochemistry).   Comment: The cellularity, cytological atypia, two cells in mitosis, and Ki-67 index of approximately 5-10% support histological CNS WHO grade 3. Microvascular proliferation and necrosis are absent. Chromosomal microarray analysis to evaluate CDKN2A is in progress, and if homozygous CDKN2A deletion is detected the CNS WHO grade will be updated.   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  She presents with clinical and radiographic syndrome consistent  with left frontal WHO grade 3 astrocytoma, IDH mutant.  Clinically, she demonstrates language and cognitive impairment which limit her insight into the nature of her condition.  She does not demonstrate understanding of the risks and benefits of treatment plan recommendations or prescribed medications.  Given these limitations, we recommended her spouse act as Scientist, research (medical).  He is agreeable with this.  We strongly recommended she return to [facility] for craniotomy, resection with Dr. Lacinda Axon.  Even a subtotal resection could provide relief given the substantial mass effect and bifrontal syndrome/abulia.  They are agreeable with this plan, though nothing is scheduled at this time.  Termination of pregnancy was also recommended and consented for.  Again, no concrete plans exist at this time for medical or surgical termination.   We emphasized the need for compliance with essential medications.  She will resume Keppra 1043m BID starting today.  She will also dose decadron 464mdaily, starting today.  This may be helpful with her headaches, at the very least.  There are a host of social issues, insurance, financial, family support.  We will touch base with social work team here, and at [place], to help work through some of these issues.  In the meantime we recommended Laura Sharp reach out to family and friends for in-home support given elevated caretaking burden.  We will also try to arrange home health, PT and OT.  Screening for potential clinical trials was performed and discussed using eligibility criteria for active protocols at [facility], loco-regional tertiary centers, as well as national database available on [email or web address].com.    The patient is not a candidate for a research protocol at this time due to no suitable study identified.   We spent twenty additional minutes teaching regarding the natural history, biology, and historical experience in the treatment of brain tumors. We then discussed in  detail the current recommendations for therapy focusing on the mode of administration, mechanism of action, anticipated toxicities, and quality of life issues associated with this plan. We also provided teaching sheets for the patient to take home as an additional resource.  We will continue to communicate with medical and social teams more broadly at [place].  Follow up TBD at this time pending those plans.   All questions were answered. The patient knows to call the clinic with any problems, questions or concerns. No barriers to learning were detected.  The total time spent in the encounter was 60 minutes and more than 50% was on counseling and review of test results   Ventura Sellers, MD Medical Director of Neuro-Oncology [facility] [place] 07/13/21 2:32 PM

## 2021-07-14 ENCOUNTER — Other Ambulatory Visit: Payer: Self-pay | Admitting: Internal Medicine

## 2021-07-14 DIAGNOSIS — C719 Malignant neoplasm of brain, unspecified: Secondary | ICD-10-CM

## 2021-07-15 ENCOUNTER — Encounter: Payer: Self-pay | Admitting: *Deleted

## 2021-07-15 ENCOUNTER — Ambulatory Visit (HOSPITAL_COMMUNITY): Payer: Managed Care, Other (non HMO)

## 2021-07-16 ENCOUNTER — Ambulatory Visit (HOSPITAL_COMMUNITY)
Admission: RE | Admit: 2021-07-16 | Discharge: 2021-07-16 | Disposition: A | Discharge status: Home / Self Care | Payer: Managed Care, Other (non HMO) | Source: Ambulatory Visit | Attending: Internal Medicine | Admitting: Internal Medicine

## 2021-07-16 ENCOUNTER — Other Ambulatory Visit: Payer: Self-pay

## 2021-07-16 DIAGNOSIS — C719 Malignant neoplasm of brain, unspecified: Secondary | ICD-10-CM | POA: Diagnosis not present

## 2021-07-16 MED ORDER — GADOBUTROL 1 MMOL/ML IV SOLN
7.5000 mL | Freq: Once | INTRAVENOUS | Status: AC | PRN
  Administered 2021-07-16: 18:00:00 7.5 mL via INTRAVENOUS

## 2021-07-17 NOTE — Progress Notes (Signed)
[place] ?Clinical Social Work ? ?Clinical Social Work was referred by medical provider for assessment of psychosocial needs.  Clinical Social Worker contacted caregiver by phone  to offer support and assess for needs, patient was also present during phone call. ? ?Patient's partner briefly shared plan to follow up with [facility] medical team for surgery and then follow up with neuro-oncology at [facility].  CSW and explored current needs and overview of resources available. ? ?Insurance: They have applied for Medicaid and Social Security Disability and have not received update. Patient's partner plans to call to receive status of claims. ? ?Financial needs: Patient and partner are unable to work. They have received limited assistance from [facility]. ? ? ?Adjustment to Illness: CSW briefly explained CSW role for resource management and emotional support.  CSW acknowledged levels of grief associated with cancer diagnosis and pregnancy termination.  CSW encouraged patient or partner to utilize counseling services. Also discussed integrative services free to patient and caregiver- Reiki, Yoga, TaiChi, Massage, EFT Tapping, caregiver support group, and [employer]. ? ?CSW and patient/caregiver agreed to follow up next week by phone. ? ? ?Gwinda Maine, LCSW  ?Clinical Social Worker ?[place] ?      ? ?

## 2021-07-22 ENCOUNTER — Ambulatory Visit (HOSPITAL_COMMUNITY): Payer: Managed Care, Other (non HMO)

## 2021-08-04 ENCOUNTER — Encounter (HOSPITAL_COMMUNITY): Payer: Self-pay | Admitting: Pharmacy Technician

## 2021-08-04 ENCOUNTER — Other Ambulatory Visit: Payer: Self-pay

## 2021-08-04 ENCOUNTER — Emergency Department (HOSPITAL_COMMUNITY)
Admission: EM | Admit: 2021-08-04 | Discharge: 2021-08-04 | Disposition: A | Discharge status: Home / Self Care | Payer: Managed Care, Other (non HMO) | Attending: Emergency Medicine | Admitting: Emergency Medicine

## 2021-08-04 DIAGNOSIS — Z85841 Personal history of malignant neoplasm of brain: Secondary | ICD-10-CM | POA: Insufficient documentation

## 2021-08-04 DIAGNOSIS — Z5321 Procedure and treatment not carried out due to patient leaving prior to being seen by health care provider: Secondary | ICD-10-CM | POA: Insufficient documentation

## 2021-08-04 LAB — CBC WITH DIFFERENTIAL/PLATELET
Abs Immature Granulocytes: 0.24 10*3/uL — ABNORMAL HIGH (ref 0.00–0.07)
Basophils Absolute: 0 10*3/uL (ref 0.0–0.1)
Basophils Relative: 0 %
Eosinophils Absolute: 0 10*3/uL (ref 0.0–0.5)
Eosinophils Relative: 0 %
HCT: 37.2 % (ref 36.0–46.0)
Hemoglobin: 12.8 g/dL (ref 12.0–15.0)
Immature Granulocytes: 2 %
Lymphocytes Relative: 7 %
Lymphs Abs: 0.8 10*3/uL (ref 0.7–4.0)
MCH: 34.4 pg — ABNORMAL HIGH (ref 26.0–34.0)
MCHC: 34.4 g/dL (ref 30.0–36.0)
MCV: 100 fL (ref 80.0–100.0)
Monocytes Absolute: 0.8 10*3/uL (ref 0.1–1.0)
Monocytes Relative: 7 %
Neutro Abs: 9.8 10*3/uL — ABNORMAL HIGH (ref 1.7–7.7)
Neutrophils Relative %: 84 %
Platelets: 251 10*3/uL (ref 150–400)
RBC: 3.72 MIL/uL — ABNORMAL LOW (ref 3.87–5.11)
RDW: 12.3 % (ref 11.5–15.5)
WBC: 11.7 10*3/uL — ABNORMAL HIGH (ref 4.0–10.5)
nRBC: 0 % (ref 0.0–0.2)

## 2021-08-04 LAB — BASIC METABOLIC PANEL
Anion gap: 8 (ref 5–15)
BUN: 6 mg/dL (ref 6–20)
CO2: 26 mmol/L (ref 22–32)
Calcium: 9.3 mg/dL (ref 8.9–10.3)
Chloride: 103 mmol/L (ref 98–111)
Creatinine, Ser: 0.75 mg/dL (ref 0.44–1.00)
GFR, Estimated: >60 mL/min (ref >60)
Glucose, Bld: 116 mg/dL — ABNORMAL HIGH (ref 70–99)
Potassium: 4.4 mmol/L (ref 3.5–5.1)
Sodium: 137 mmol/L (ref 135–145)

## 2021-08-04 NOTE — ED Notes (Signed)
Called several times no response ?

## 2021-08-04 NOTE — ED Notes (Signed)
Pt stepped outside.  

## 2021-08-04 NOTE — ED Notes (Signed)
Patient called 3x for room, tried calling cellphone and no response. ?

## 2021-08-04 NOTE — ED Triage Notes (Signed)
Pt here requesting MRI for a second opinion. Pt reports dx with brain cancer several months ago.  ?

## 2021-08-04 NOTE — ED Provider Triage Note (Signed)
Emergency Medicine Provider Triage Evaluation Note ? ?Laura Sharp , a 21 y.o. female  was evaluated in triage.  Patient with recently diagnosed brain cancer.  Patient and her significant other present with wanting to have a second opinion.  They have been seen by neurologic and has had a recent MRI.  She has not yet started treatment.  They want to get the MRI repeated to see if there are any changes from the prior imaging. ?They state that symptoms have not really changed any since the last MRI.  Symptoms include difficulty walking, memory problems, speech problems that have been going on for several months. ? ?Review of Systems  ?Positive:  ?Negative:  ? ?Physical Exam  ?BP 107/63 (BP Location: Left Arm)   Pulse 82   Temp 99.2 ?F (37.3 ?C) (Oral)   Resp 16   LMP  (LMP Unknown)   SpO2 100%  ?Gen:   Awake, no distress   ?Resp:  Normal effort  ?MSK:   Moves extremities without difficulty  ?Other:  ? ?Medical Decision Making  ?Medically screening exam initiated at 1:02 PM.  Appropriate orders placed.  Laura Sharp was informed that the remainder of the evaluation will be completed by another provider, this initial triage assessment does not replace that evaluation, and the importance of remaining in the ED until their evaluation is complete. ? ? ?  ?Adolphus Birchwood, PA-C ?08/04/21 1304 ? ?

## 2021-08-14 DIAGNOSIS — R32 Unspecified urinary incontinence: Secondary | ICD-10-CM | POA: Diagnosis not present

## 2021-08-26 ENCOUNTER — Emergency Department (HOSPITAL_COMMUNITY): Payer: Managed Care, Other (non HMO)

## 2021-08-26 ENCOUNTER — Encounter (HOSPITAL_COMMUNITY): Payer: Self-pay | Admitting: Emergency Medicine

## 2021-08-26 ENCOUNTER — Emergency Department (HOSPITAL_COMMUNITY)
Admission: EM | Admit: 2021-08-26 | Discharge: 2021-08-26 | Disposition: A | Discharge status: Home / Self Care | Payer: Managed Care, Other (non HMO) | Attending: Emergency Medicine | Admitting: Emergency Medicine

## 2021-08-26 ENCOUNTER — Other Ambulatory Visit: Payer: Self-pay

## 2021-08-26 ENCOUNTER — Ambulatory Visit: Payer: Self-pay

## 2021-08-26 DIAGNOSIS — N9489 Other specified conditions associated with female genital organs and menstrual cycle: Secondary | ICD-10-CM | POA: Diagnosis not present

## 2021-08-26 DIAGNOSIS — N760 Acute vaginitis: Secondary | ICD-10-CM | POA: Diagnosis not present

## 2021-08-26 DIAGNOSIS — R109 Unspecified abdominal pain: Secondary | ICD-10-CM | POA: Diagnosis present

## 2021-08-26 DIAGNOSIS — B9689 Other specified bacterial agents as the cause of diseases classified elsewhere: Secondary | ICD-10-CM | POA: Diagnosis not present

## 2021-08-26 DIAGNOSIS — R197 Diarrhea, unspecified: Secondary | ICD-10-CM

## 2021-08-26 HISTORY — DX: Malignant (primary) neoplasm, unspecified: C80.1

## 2021-08-26 LAB — BASIC METABOLIC PANEL
Anion gap: 7 (ref 5–15)
BUN: <5 mg/dL — ABNORMAL LOW (ref 6–20)
CO2: 28 mmol/L (ref 22–32)
Calcium: 9.6 mg/dL (ref 8.9–10.3)
Chloride: 104 mmol/L (ref 98–111)
Creatinine, Ser: 0.72 mg/dL (ref 0.44–1.00)
GFR, Estimated: >60 mL/min (ref >60)
Glucose, Bld: 122 mg/dL — ABNORMAL HIGH (ref 70–99)
Potassium: 3.5 mmol/L (ref 3.5–5.1)
Sodium: 139 mmol/L (ref 135–145)

## 2021-08-26 LAB — CBC
HCT: 41.1 % (ref 36.0–46.0)
Hemoglobin: 13.7 g/dL (ref 12.0–15.0)
MCH: 34 pg (ref 26.0–34.0)
MCHC: 33.3 g/dL (ref 30.0–36.0)
MCV: 102 fL — ABNORMAL HIGH (ref 80.0–100.0)
Platelets: 333 10*3/uL (ref 150–400)
RBC: 4.03 MIL/uL (ref 3.87–5.11)
RDW: 13 % (ref 11.5–15.5)
WBC: 11.4 10*3/uL — ABNORMAL HIGH (ref 4.0–10.5)
nRBC: 0 % (ref 0.0–0.2)

## 2021-08-26 LAB — URINALYSIS, ROUTINE W REFLEX MICROSCOPIC
Bilirubin Urine: NEGATIVE
Glucose, UA: NEGATIVE mg/dL
Hgb urine dipstick: NEGATIVE
Ketones, ur: NEGATIVE mg/dL
Leukocytes,Ua: NEGATIVE
Nitrite: NEGATIVE
Protein, ur: NEGATIVE mg/dL
Specific Gravity, Urine: >1.046 — ABNORMAL HIGH (ref 1.005–1.030)
pH: 6 (ref 5.0–8.0)

## 2021-08-26 LAB — WET PREP, GENITAL
Sperm: NONE SEEN
Trich, Wet Prep: NONE SEEN
WBC, Wet Prep HPF POC: >=10 — AB (ref <10)
Yeast Wet Prep HPF POC: NONE SEEN

## 2021-08-26 LAB — I-STAT BETA HCG BLOOD, ED (MC, WL, AP ONLY): I-stat hCG, quantitative: <5 m[IU]/mL (ref <5)

## 2021-08-26 MED ORDER — IOHEXOL 300 MG/ML  SOLN
100.0000 mL | Freq: Once | INTRAMUSCULAR | Status: AC | PRN
  Administered 2021-08-26: 100 mL via INTRAVENOUS

## 2021-08-26 MED ORDER — GADOBUTROL 1 MMOL/ML IV SOLN
8.0000 mL | Freq: Once | INTRAVENOUS | Status: AC | PRN
  Administered 2021-08-26: 8 mL via INTRAVENOUS

## 2021-08-26 MED ORDER — METRONIDAZOLE 500 MG PO TABS: 500.0000 mg | ORAL_TABLET | Freq: Two times a day (BID) | ORAL | 0 refills | Status: AC

## 2021-08-26 NOTE — ED Triage Notes (Signed)
Patient arrives with spouse c/o yesterday morning about 6am patient having stool coming from vagina. Reports 5 episodes since yesterday. Pt spouse also requesting MRI brain for patient for 2nd opinion and requesting help with paperwork for handicap  ?

## 2021-08-26 NOTE — ED Notes (Signed)
Bladder Scan: 0ML ?

## 2021-08-26 NOTE — Discharge Instructions (Addendum)
As discussed,  there was a hyperdensity noticed on your CT scan in the endometrium which is within your uterus, we recommend following up with your OB/GYN for this.  There is no evidence of fistula on your CT scan.  Your wet prep did show some clue cells which could be indicative of bacterial vaginosis, we will treat this with the prescribed antibiotic.  Please take as directed until finished.  Your urinalysis did not show any signs of urinary tract infection. ? ?As discussed, your MRI did show that your tumor is getting bigger.  These make sure to follow-up with Dr. Mickeal Skinner. ? ?Return to the ER for any new or worsening symptoms ?

## 2021-08-26 NOTE — ED Notes (Signed)
Patient transported to MRI 

## 2021-08-26 NOTE — ED Provider Notes (Signed)
Shared visit.  Patient here with diarrhea, lower abdominal pain.  Patient with history of brain cancer following with oncology at [facility].  Has not started treatment.  Still weighing her options with surgery, chemotherapy and radiation as options.  She has had evaluations by neurosurgery at multiple places and by oncology.  She is to follow-up with neuro oncologist with Dr. Mickeal Skinner here soon.  Family requesting new MRI but otherwise she is neurologically at her baseline.  Family thinks that if tumor is progressing they feel like patient will be more amenable to treatment.  We will get MRI.  As far as her diarrhea and concern for may be rectovaginal fistula we will get a CT scan abdomen pelvis.  She did have a D&C for first trimester abortion last month.  Lab work per my review and interpretation shows no significant anemia, electrolyte abnormality, kidney injury.  Clue cells are present and will treat for BV.  MRI of the brain was done and overall fairly stable findings.  Slightly increased tumor growth.  Family made aware of this.  Strongly encourage follow-up with neuro oncologist.  CT scan overall unremarkable.  There is some hyperdensity within the endometrium but talked with Dr. Ilda Basset on the phone and will have her follow-up with her OB/GYN.  Imaging was overall unremarkable.  No concern for fistula at this time.  Discharged in good condition. ? ?This chart was dictated using voice recognition software.  Despite best efforts to proofread,  errors can occur which can change the documentation meaning.  ?  Lennice Sites, DO ?08/26/21 2230 ? ?

## 2021-08-26 NOTE — Telephone Encounter (Signed)
? ? ? ?  Chief Complaint: Stool passing through vagina, wears Depends per friend. ?Symptoms: Above ?Frequency: Yesterday ?Pertinent Negatives: Patient denies pain ?Disposition: '[x]'$ ED /'[]'$ Urgent Care (no appt availability in office) / '[]'$ Appointment(In office/virtual)/ '[]'$  [facility]/ '[]'$ Home Care/ '[]'$ Refused Recommended Disposition /'[]'$  Mobile Bus/ '[]'$  Follow-up with PCP ?Additional Notes: Pt. Does not have GYN or PCP currently. Will go to ED. Seen at [facility] for brain tumor. Had abortion at [facility] 07/30/28.  ?Reason for Disposition ? Patient sounds very sick or weak to the triager ? ?Answer Assessment - Initial Assessment Questions ?1. SYMPTOM: "What's the main symptom you're concerned about?" (e.g., pain, itching, dryness) ?    Stool through from vagina ?2. LOCATION: "Where is the   located?" (e.g., inside/outside, left/right) ?    Stool ?3. ONSET: "When did the  symptoms  start?" ?    Yesterday ?4. PAIN: "Is there any pain?" If Yes, ask: "How bad is it?" (Scale: 1-10; mild, moderate, severe) ?    No pain ?5. ITCHING: "Is there any itching?" If Yes, ask: "How bad is it?" (Scale: 1-10; mild, moderate, severe) ?    No ?6. CAUSE: "What do you think is causing the discharge?" "Have you had the same problem before? What happened then?" ?    Unsure ?7. OTHER SYMPTOMS: "Do you have any other symptoms?" (e.g., fever, itching, vaginal bleeding, pain with urination, injury to genital area, vaginal foreign body) ?    Incontinent - has a brain tumor ?8. PREGNANCY: "Is there any chance you are pregnant?" "When was your last menstrual period?" ?    No ? ?Protocols used: Vaginal Symptoms-A-AH ? ?

## 2021-08-26 NOTE — ED Notes (Signed)
Patient transported to CT 

## 2021-08-26 NOTE — ED Provider Notes (Signed)
?[facility] ?Provider Note ? ? ?CSN: [id number] ?Arrival date & time: 08/26/21  1305 ? ?  ? ?History ? ?Chief Complaint  ?Patient presents with  ? Diarrhea  ? ? ?Laura Sharp is a 21 y.o. female. ? 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  History provided largely by husband at bedside.  Patient reportedly has been having urine mixed with stool over the last 48 hours.  Patient's husband he originally thought that she may be mixing her stool with urine as she is incontinent of urine and bowel, however on further inspection he thinks that she is having bowel movements from her vagina. He states he is concerned that this may have been caused by her D&C abortion done with [facility] in March.  She has not had this problem before.  She denies any fevers, though does endorse mild abdominal discomfort. No recent antibiotic use.  ? ?She is followed by [facility] oncology and has been evaluated by oncology here at [facility].  She has not proceeded forward with treatment, still contemplating a treatment course.  She was referred to Dr. Mickeal Skinner a neuro oncologist by [facility].  Patient's husband at bedside is requesting a repeat MRI to "see what the status of the tumor is" however patient remains at her neurologic baseline.  ? ?Also states that they have been searching for resources for support at home.  He states he is in the process of discussing options with her insurance. ?  ? ?Home Medications ?Prior to Admission medications   ?Medication Sig Start Date End Date Taking? Authorizing Provider  ?metroNIDAZOLE (FLAGYL) 500 MG tablet Take 1 tablet (500 mg total) by mouth 2 (two) times daily for 7 days. 08/26/21 09/02/21 Yes Garald Balding, PA-C  ?dexamethasone (DECADRON) 4 MG tablet Take 1 tablet (4 mg total) by mouth 3 (three) times daily. 06/14/21    Ghimire, Henreitta Leber, MD  ?folic acid (FOLVITE) 1 MG tablet Take 4 tablets (4 mg total) by mouth daily. ?Patient not taking: Reported on 07/13/2021 06/14/21   Jonetta Osgood, MD  ?levETIRAcetam (KEPPRA) 500 MG tablet Take 1 tablet (500 mg total) by mouth 2 (two) times daily. 06/14/21   Ghimire, Henreitta Leber, MD  ?Prenatal Vit-Fe Fumarate-FA (NEONATAL VITAMIN) 27-0.8 MG TABS Take 1 tablet by mouth daily at 8 pm. ?Patient not taking: Reported on 07/13/2021 06/14/21   Jonetta Osgood, MD  ?   ? ?Allergies    ?Penicillins   ? ?Review of Systems   ?Review of Systems ?Ten systems reviewed and are negative for acute change, except as noted in the HPI.  ? ?Physical Exam ?Updated Vital Signs ?BP 120/82   Pulse 64   Temp 99.1 ?F (37.3 ?C) (Oral)   Resp 15   Ht '5\' 7"'$  (1.702 m)   Wt 81.6 kg   LMP  (LMP Unknown) Comment: abortion mid March  SpO2 98%   Breastfeeding Unknown   BMI 28.19 kg/m?  ?Physical Exam ?Vitals and nursing note reviewed.  ?Constitutional:   ?   General: She is not in acute distress. ?   Appearance: She is well-developed.  ?HENT:  ?   Head: Normocephalic and atraumatic.  ?Eyes:  ?   Conjunctiva/sclera: Conjunctivae normal.  ?Cardiovascular:  ?   Rate and Rhythm: Normal rate and regular rhythm.  ?   Heart sounds: No murmur heard. ?  Pulmonary:  ?   Effort: Pulmonary effort is normal. No respiratory distress.  ?   Breath sounds: Normal breath sounds.  ?Abdominal:  ?   Palpations: Abdomen is soft.  ?   Tenderness: There is no abdominal tenderness.  ?Genitourinary: ?   Comments: GU exam performed with RN at bedside.  There was vaginal discharge mixed with stool in the vaginal vault, no CMT, no adnexal tenderness bilaterally.  ?Musculoskeletal:     ?   General: No swelling.  ?   Cervical back: Neck supple.  ?Skin: ?   General: Skin is warm and dry.  ?   Capillary Refill: Capillary refill takes less than 2 seconds.  ?Neurological:  ?   Mental Status: She is alert.  ?Psychiatric:     ?   Mood and Affect: Mood normal.   ? ? ?ED Results / Procedures / Treatments   ?Labs ?(all labs ordered are listed, but only abnormal results are displayed) ?Labs Reviewed  ?WET PREP, GENITAL - Abnormal; Notable for the following components:  ?    Result Value  ? Clue Cells Wet Prep HPF POC PRESENT (*)   ? WBC, Wet Prep HPF POC >=10 (*)   ? All other components within normal limits  ?CBC - Abnormal; Notable for the following components:  ? WBC 11.4 (*)   ? MCV 102.0 (*)   ? All other components within normal limits  ?BASIC METABOLIC PANEL - Abnormal; Notable for the following components:  ? Glucose, Bld 122 (*)   ? BUN <5 (*)   ? All other components within normal limits  ?URINALYSIS, ROUTINE W REFLEX MICROSCOPIC - Abnormal; Notable for the following components:  ? Specific Gravity, Urine >1.046 (*)   ? All other components within normal limits  ?I-STAT BETA HCG BLOOD, ED ([facility], [facility], [facility] ONLY)  ?GC/CHLAMYDIA PROBE AMP ([place]) NOT AT [facility]  ? ? ?EKG ?None ? ?Radiology ?MR Brain W and Wo Contrast ? ?Result Date: 08/26/2021 ?CLINICAL DATA:  Brain tumor EXAM: MRI HEAD WITHOUT AND WITH CONTRAST TECHNIQUE: Multiplanar, multiecho pulse sequences of the brain and surrounding structures were obtained without and with intravenous contrast. CONTRAST:  68m GADAVIST GADOBUTROL 1 MMOL/ML IV SOLN COMPARISON:  07/16/2021 FINDINGS: Brain: Large, heterogeneous, predominantly peripherally contrast-enhancing mass centered in the left frontal lobe has increased in size, now 5.8 x 4.4 cm, previously 5.5 x 3.5 cm. The craniocaudal dimension of the mass has greatly increased to 6.7 cm, previously 5.4 cm. There is petechial hemorrhage within the mass. Unchanged rightward midline shift of 14 mm. The configuration of the ventricles is unchanged with effaced left frontal horn and dilated right lateral ventricle atrium and temporal horn. Periventricular hyperintense T2-weighted signal is unchanged. Vascular: Major flow voids are preserved. Skull and upper cervical spine: Normal  calvarium and skull base. Visualized upper cervical spine and soft tissues are normal. Sinuses/Orbits:No paranasal sinus fluid levels or advanced mucosal thickening. No mastoid or middle ear effusion. Normal orbits. IMPRESSION: Increased size of left frontal lobe tumor (most consistent with high-grade glioma) with unchanged rightward midline shift of 14 mm and subfalcine herniation. Electronically Signed   By: KUlyses JarredM.D.   On: 08/26/2021 21:29  ? ?CT ABDOMEN PELVIS W CONTRAST ? ?Result Date: 08/26/2021 ?CLINICAL DATA:  Acute abdominal pain. EXAM: CT ABDOMEN AND PELVIS WITH CONTRAST TECHNIQUE: Multidetector CT imaging of the abdomen and pelvis was performed using the standard protocol following bolus administration of intravenous contrast. RADIATION DOSE REDUCTION: This exam was performed  according to the departmental dose-optimization program which includes automated exposure control, adjustment of the mA and/or kV according to patient size and/or use of iterative reconstruction technique. CONTRAST:  187m OMNIPAQUE IOHEXOL 300 MG/ML  SOLN COMPARISON:  None. FINDINGS: Lower chest: No acute abnormality. Hepatobiliary: No focal liver abnormality is seen. No gallstones, gallbladder wall thickening, or biliary dilatation. Pancreas: Unremarkable. No pancreatic ductal dilatation or surrounding inflammatory changes. Spleen: Normal in size without focal abnormality. Adrenals/Urinary Tract: Adrenal glands are unremarkable. Kidneys are normal, without renal calculi, focal lesion, or hydronephrosis. Bladder is unremarkable. Stomach/Bowel: Stomach is within normal limits. Appendix appears normal. No evidence of bowel wall thickening, distention, or inflammatory changes. Vascular/Lymphatic: No significant vascular findings are present. No enlarged abdominal or pelvic lymph nodes. Reproductive: There some heterogeneous hyperdensity in the region of the endometrium, indeterminate. Adnexa are within normal limits. Other:  Trace free fluid in the pelvis. No focal abdominal wall hernia. Musculoskeletal: No acute or significant osseous findings. IMPRESSION: 1. Trace free fluid in the pelvis may be physiologic. 2. Heterogeneous hyperde

## 2021-08-26 NOTE — ED Provider Triage Note (Signed)
Emergency Medicine Provider Triage Evaluation Note ? ?Armen Pickup , a 21 y.o. female  was evaluated in triage.  Patient has a history of brain tumor and is occasionally communicative.  Here with partner who supplies most of history.  She reportedly has been having stool coming from her vagina.  He is unable to state when her last proper menstrual period was.  She is having what seems to be fecal incontinence. ? ?Patient had an abortion 1 month ago. ? ?Currently follows with [facility] for brain tumor ? ?Review of Systems  ?Patient tells me she has mild amounts of discomfort when she pees.  Does not answer other questions. ? ?Physical Exam  ?BP 98/86 (BP Location: Left Arm)   Pulse 86   Temp 99.1 ?F (37.3 ?C) (Oral)   Resp 16   Ht '5\' 7"'$  (1.702 m)   Wt 81.6 kg   LMP  (LMP Unknown) Comment: abortion mid March  SpO2 100%   Breastfeeding Unknown   BMI 28.19 kg/m?  ?Gen:   Awake, no distress   ?Resp:  Normal effort  ?MSK:   Moves extremities without difficulty  ?Other:  Patient is slow to respond to questions ? ?Medical Decision Making  ?Medically screening exam initiated at 1:34 PM.  Appropriate orders placed.  Frederica Beaubien was informed that the remainder of the evaluation will be completed by another provider, this initial triage assessment does not replace that evaluation, and the importance of remaining in the ED until their evaluation is complete. ? ? ?  ?Rhae Hammock, PA-C ?08/26/21 1336 ? ?

## 2021-08-26 NOTE — ED Provider Notes (Incomplete)
I personally evaluated the patient during the encounter and completed a history, physical, procedures, medical decision making to contribute to the overall care of the patient and decision making for the patient briefly, the patient is a 21 y.o. female  ? ?.  ? ? EKG Interpretation ?None ? ?  ? ?  ? ? ? ? ?

## 2021-08-28 LAB — GC/CHLAMYDIA PROBE AMP (~~LOC~~) NOT AT ARMC
Chlamydia: NEGATIVE
Comment: NEGATIVE
Comment: NORMAL
Neisseria Gonorrhea: NEGATIVE

## 2021-10-02 ENCOUNTER — Telehealth: Payer: Self-pay | Admitting: Internal Medicine

## 2021-10-02 NOTE — Telephone Encounter (Signed)
.  Called patient to schedule appointment per 5/25 inbasket, patient is aware of date and time.   

## 2021-10-08 ENCOUNTER — Other Ambulatory Visit: Payer: Self-pay

## 2021-10-08 ENCOUNTER — Inpatient Hospital Stay: Payer: Managed Care, Other (non HMO) | Attending: Internal Medicine | Admitting: Internal Medicine

## 2021-10-08 VITALS — BP 102/69 | HR 81 | Temp 97.9°F | Resp 20 | Wt 166.2 lb

## 2021-10-08 DIAGNOSIS — H547 Unspecified visual loss: Secondary | ICD-10-CM | POA: Insufficient documentation

## 2021-10-08 DIAGNOSIS — C711 Malignant neoplasm of frontal lobe: Secondary | ICD-10-CM | POA: Insufficient documentation

## 2021-10-08 DIAGNOSIS — C719 Malignant neoplasm of brain, unspecified: Secondary | ICD-10-CM | POA: Diagnosis not present

## 2021-10-08 DIAGNOSIS — R569 Unspecified convulsions: Secondary | ICD-10-CM | POA: Diagnosis not present

## 2021-10-08 MED ORDER — DIVALPROEX SODIUM 500 MG PO DR TAB: 500.0000 mg | DELAYED_RELEASE_TABLET | Freq: Two times a day (BID) | ORAL | 1 refills | Status: DC

## 2021-10-08 NOTE — Progress Notes (Signed)
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 Referred to Neurology who put her on Diamox for papilledema.   05/07/2021 Radiology Study  MRI revealed large left frontal mass with enhancement and scattered small cystic foci. Referred to neurosurgery at [facility] and recommended to surgery.   06/13/2021 Clinical Event-Other  Presented to ER at [facility] with new onset witness generalized seizure. CT of the head revealed worsening cerebral edema with 12 mm midline shift and enlarging left frontal enhancing mass. She was also found to have a positive pregnancy test at this time.   Transferred to [facility].  06/15/2021 Biopsy  Biopsy with Dr. Deetta Perla at [facility]. Pathology returned astrocytoma (WHO 3). See table below for biomarkers.   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 Deetta Perla at [facility]. Pathology reveals astrocytoma (WHO 3). IDH mutated.   06/18/2021 Clinical Event-Other  Consult with OB/GYN. [place] and serial beta HCG values c/w  developing pregnancy. Pt voiced desire to terminate pregnancy. Planned to have this [facility] with [facility].   07/02/2021 Clinical Event-Other  New patient evaluation at the [facility]. Recommended she return to Neurosurgery for resection.   Biomarkers:  MGMT Unknown.  IDH 1/2 Mutated.  EGFR Unknown  1p/19q Intact   Interval History: Laura Sharp and her husband present today to re-establish care.  She had been advised to initiate radiation and avastin by [facility] team in March, but did not follow through with these interventions.  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  She has been in this condition "at least a month".  Otherwise, her functional status is not significantly worse from prior visit.  She continues to do some walking with support and guidance around the home.  All ADLs are requiring support at this time, but mainly because of visual impairment.  She has not been dosing her seizure medication, and did experience a "full body" shaking seizure last month.    H+P (07/13/21) Patient presents today for local care for frontal astrocytoma, referred by her primary oncology team at [facility].  Patient is now 57w1dper LMP.  She and her husband describe ongoing decline in function, with decreased ability to express herself using words.  Much of history and discussion was through husband, Laura Sharp.  She does walk some on her own, but is relying mostly on a rolling walker or a wheelchair.  She is not independent with most activities of daily living, requiring full assistance and supervision from Laura Sharp  There is no other assistance locally, with family not nearby.  She has not been  dosing Keppra or Decadron due to patient refusal.  Does not have either surgery or D+C scheduled at this time.  Medications: Current Outpatient Medications on File Prior to Visit  Medication Sig Dispense Refill   folic acid (FOLVITE) 1 MG tablet Take 4 tablets  (4 mg total) by mouth daily. 120 tablet 3   NON FORMULARY Take 1 Dose by mouth daily. [place]     NON FORMULARY Take 1 Dose by mouth 2 (two) times daily. Quercetin     NON FORMULARY Take 1 Dose by mouth daily. [facility] [place]     Prenatal Vit-Fe Fumarate-FA (NEONATAL VITAMIN) 27-0.8 MG TABS Take 1 tablet by mouth daily at 8 pm. 30 tablet 3   No current facility-administered medications on file prior to visit.    Allergies:  Allergies  Allergen Reactions   Penicillins Other (See Comments) and Nausea Only    Maternal allergy   Past Medical History:  Past Medical History:  Diagnosis Date   Allergy    Cancer ([place])    Past Surgical History:  Past Surgical History:  Procedure Laterality Date   HERNIA REPAIR     Social History:  Social History   Socioeconomic History   Marital status: Married    Spouse name: Not on file   Number of children: Not on file   Years of education: Not on file   Highest education level: Not on file  Occupational History   Not on file  Tobacco Use   Smoking status: Some Days   Smokeless tobacco: Never  Substance and Sexual Activity   Alcohol use: Not Currently   Drug use: Not Currently   Sexual activity: Not on file  Other Topics Concern   Not on file  Social History Narrative   Not on file   Social Determinants of Health   Financial Resource Strain: Not on file  Food Insecurity: Not on file  Transportation Needs: Not on file  Physical Activity: Not on file  Stress: Not on file  Social Connections: Not on file  Intimate Partner Violence: Not on file   Family History:  Family History  Family history unknown: Yes    Review of Systems: Limited by dysphasia  Physical Exam: Vitals:   10/08/21 1223  BP: 102/69  Pulse: 81  Resp: 20  Temp: 97.9 F (36.6 C)  SpO2: 100%   KPS: 60. General: Alert, cooperative, pleasant, in no acute distress Head: Normal EENT: No conjunctival injection or scleral icterus.  Lungs: Resp effort  normal Cardiac: Regular rate Abdomen: Non-distended abdomen Skin: No rashes cyanosis or petechiae. Extremities: No clubbing or edema  Neurologic Exam: Mental Status: Awake, alert. Grossly oriented to self and environment. Language is impaired with regards to fluency and comprehension.  Affect is consistent with abulia.  Cranial Nerves: Visual acuity is NLP bilaterally. Extra-ocular movements intact. No ptosis. Face is symmetric Motor: Tone and bulk are normal. Power is 4/5 in right arm, 4/5 in right leg. Reflexes are symmetric, no pathologic reflexes present.  Sensory: Intact to light touch Gait: Hemiparetic, dystaxic   Labs: I have reviewed the data as listed    Component Value Date/Time   NA 139 08/26/2021 1342   K 3.5 08/26/2021 1342   CL 104 08/26/2021 1342   CO2 28 08/26/2021 1342   GLUCOSE 122 (H) 08/26/2021 1342   BUN <5 (L) 08/26/2021 1342   CREATININE 0.72 08/26/2021 1342   CALCIUM 9.6 08/26/2021 1342   PROT 6.7 06/14/2021 0141   ALBUMIN  3.9 06/14/2021 0141   AST 11 (L) 06/14/2021 0141   ALT 16 06/14/2021 0141   ALKPHOS 39 06/14/2021 0141   BILITOT 0.9 06/14/2021 0141   GFRNONAA >60 08/26/2021 1342   Lab Results  Component Value Date   WBC 11.4 (H) 08/26/2021   NEUTROABS 9.8 (H) 08/04/2021   HGB 13.7 08/26/2021   HCT 41.1 08/26/2021   MCV 102.0 (H) 08/26/2021   PLT 333 08/26/2021    Imaging: No results found.    Pathology: A-B. Brain, left frontal, stereotactic biopsy:   Integrated diagnosis: Astrocytoma, IDH-mutant, CNS WHO grade 3 (pending CDKN2A testing), see comment. Histopathological classification: Astrocytoma. Molecular information: IDH1 R132H is positive in tumor cells, ATRX shows loss of nuclear expression in tumor cells, and p53 is positive in approximately 20% of tumor cells (immunohistochemistry).   Comment: The cellularity, cytological atypia, two cells in mitosis, and Ki-67 index of approximately 5-10% support histological CNS WHO grade  3. Microvascular proliferation and necrosis are absent. Chromosomal microarray analysis to evaluate CDKN2A is in progress, and if homozygous CDKN2A deletion is detected the CNS WHO grade will be updated.    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  Prior scan from April demonstrated radiographic progression.  She has decided to reconsider treatment recommendations.  She understands treatment will not be curative and may have limited benefit given significant treatment delay, tumor size and location.    She could still benefit from radiation (IMRT), but will need to dose avastin concurrently because of size of tumor, high risk for inflammation and herniation.  Will also need to obtain an updated MRI brain prior to starting therapy.  She has refused conventional chemotherapy, Temodar.  For [place] to treat, she will need to be on seizure medication.  Because of hallucinations, mood issues, recommended Depakote 530m BID to start.  We can also follow drug levels with Depakote.    Avastin dose will be 182mkg IV q2 weeks.  We will initiate therapy PRIOR to starting radiation.    She and her husband were not interested in home nursing referral.  We did not recommend any herbal supplements for her glioma.  We will get MRI and avastin ordered, as well as radiation oncology referral, scheduled ASAP.    All questions were answered. The patient knows to call the clinic with any problems, questions or concerns. No barriers to learning were detected.  The total time spent in the encounter was 40 minutes and more than 50% was on counseling and review of test results   ZaVentura SellersMD Medical Director of Neuro-Oncology [facility]6/01/23 4:45 PM

## 2021-10-08 NOTE — Progress Notes (Signed)
START ON PATHWAY REGIMEN - Neuro     A cycle is every 14 days:     Bevacizumab-xxxx   **Always confirm dose/schedule in your pharmacy ordering system**  Patient Characteristics: Glioma, Grade 3 or 4 Astrocytoma, IDH-mutant, Recurrent or Progressive, Adjuvant Therapy, Systemic Therapy Candidate, BRAF V600E Mutation Negative/Unknown and NTRK Fusion Negative/Unknown Disease Classification: Glioma Disease Classification: Grade 3 or 4 Astrocytoma, IDH-mutant Disease Status: Recurrent or Progressive Treatment Classification: Adjuvant Therapy Treatment (Nonsurgical/Adjuvant): Systemic Therapy Candidate NTRK Gene Fusion Status: Negative BRAF V600E Mutation Status: Negative Intent of Therapy: Non-Curative / Palliative Intent, Discussed with Patient 

## 2021-10-13 ENCOUNTER — Other Ambulatory Visit: Payer: Self-pay | Admitting: Radiation Therapy

## 2021-10-16 ENCOUNTER — Ambulatory Visit (HOSPITAL_COMMUNITY)
Admission: RE | Admit: 2021-10-16 | Discharge: 2021-10-16 | Disposition: A | Discharge status: Home / Self Care | Payer: Managed Care, Other (non HMO) | Source: Ambulatory Visit | Attending: Internal Medicine | Admitting: Internal Medicine

## 2021-10-16 DIAGNOSIS — C719 Malignant neoplasm of brain, unspecified: Secondary | ICD-10-CM

## 2021-10-16 MED ORDER — GADOBUTROL 1 MMOL/ML IV SOLN
7.0000 mL | Freq: Once | INTRAVENOUS | Status: AC | PRN
Start: 2021-10-16 — End: 2021-10-16
  Administered 2021-10-16: 7 mL via INTRAVENOUS

## 2021-10-17 ENCOUNTER — Ambulatory Visit (HOSPITAL_COMMUNITY): Admission: RE | Admit: 2021-10-17 | Payer: Managed Care, Other (non HMO) | Source: Ambulatory Visit

## 2021-10-19 ENCOUNTER — Inpatient Hospital Stay: Payer: Managed Care, Other (non HMO)

## 2021-10-19 ENCOUNTER — Other Ambulatory Visit: Payer: Self-pay | Admitting: Radiation Therapy

## 2021-10-19 DIAGNOSIS — C719 Malignant neoplasm of brain, unspecified: Secondary | ICD-10-CM

## 2021-10-20 ENCOUNTER — Telehealth: Payer: Self-pay | Admitting: Internal Medicine

## 2021-10-20 NOTE — Telephone Encounter (Signed)
.  Called pt per 6/12 inbasket , Patient was unavailable, a message with appt time and date was left with number on file.

## 2021-10-20 NOTE — Progress Notes (Signed)
Pharmacist Chemotherapy Monitoring - Initial Assessment    Anticipated start date: 10/27/21   The following has been reviewed per standard work regarding the patient's treatment regimen: The patient's diagnosis, treatment plan and drug doses, and organ/hematologic function Lab orders and baseline tests specific to treatment regimen  The treatment plan start date, drug sequencing, and pre-medications Prior authorization status  Patient's documented medication list, including drug-drug interaction screen and prescriptions for anti-emetics and supportive care specific to the treatment regimen The drug concentrations, fluid compatibility, administration routes, and timing of the medications to be used The patient's access for treatment and lifetime cumulative dose history, if applicable  The patient's medication allergies and previous infusion related reactions, if applicable   Changes made to treatment plan:  N/A  Follow up needed:  Pending authorization for treatment    Philomena Course, [place], 10/20/2021  1:26 PM

## 2021-10-21 ENCOUNTER — Ambulatory Visit
Admission: RE | Admit: 2021-10-21 | Discharge: 2021-10-21 | Disposition: A | Discharge status: Home / Self Care | Payer: Managed Care, Other (non HMO) | Source: Ambulatory Visit | Attending: Radiation Oncology | Admitting: Radiation Oncology

## 2021-10-21 ENCOUNTER — Encounter: Payer: Self-pay | Admitting: Radiation Oncology

## 2021-10-21 ENCOUNTER — Other Ambulatory Visit: Payer: Self-pay

## 2021-10-21 VITALS — BP 111/83 | HR 96 | Temp 97.6°F | Resp 20 | Wt 167.2 lb

## 2021-10-21 DIAGNOSIS — Z79899 Other long term (current) drug therapy: Secondary | ICD-10-CM | POA: Insufficient documentation

## 2021-10-21 DIAGNOSIS — C719 Malignant neoplasm of brain, unspecified: Secondary | ICD-10-CM

## 2021-10-21 DIAGNOSIS — C711 Malignant neoplasm of frontal lobe: Secondary | ICD-10-CM | POA: Diagnosis present

## 2021-10-21 DIAGNOSIS — Z87891 Personal history of nicotine dependence: Secondary | ICD-10-CM | POA: Insufficient documentation

## 2021-10-21 NOTE — Progress Notes (Signed)
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  She was referred to Neurology.  MRI brain was obtained 05/07/2022 which revealed a large left frontal mass with enhancement and scattered small cystic foci.  She was referred to neurosurgery at [facility].  Patient presented to [facility] 06/13/2021 with new onset witnessed generalized seizure.  CT of the head revealed worsening cerebral edema with 12 mm midline shift and enlarging left frontal enhancing mass.  She was also found to have a positive pregnancy test.  She was transferred to [facility] to receive her care.  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 Mickeal Skinner 10/08/2021 -She has decided to reconsider treatment recommendations.  She understands treatment will not be curative and may have limited benefit given significant treatment delay, tumor size and location. -She could still benefit from radiation (IMRT), but will need to dose avastin concurrently because of size of tumor, high risk for inflammation and herniation.  Will also need to obtain an updated MRI brain prior to starting therapy.  She has refused conventional chemotherapy, Temodar. -Avastin dose will be '10mg'$ /kg IV q2 weeks.  We will initiate therapy PRIOR to starting radiation -We will get MRI and avastin ordered, as well as radiation oncology referral, scheduled ASAP.      Dose of Decadron, if applicable: 4 mg BID  Recent neurologic symptoms, if any:  Seizures: last seizure 10/08/2021, lasted about 30 seconds, bilateral arm shaking. Headaches: No Nausea: No Dizziness/ataxia: Baseline dizziness due to vision loss.  Ataxia noted when reaching  out to touch others, and feeding self. Difficulty with hand coordination: Yes, due to decreased vision. Focal numbness/weakness: Denies. 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 Confusion/Memory deficits: No,  Gait: Ambulatory with a walking stick and assistance. ADL:  She requires full assistance and supervision  Speech: Speech is clear, no word finding noted recently.  Husband reports she has excess saliva that may cause some impaired speech on occasion.    SAFETY ISSUES: Prior radiation? No Pacemaker/ICD? No Possible current pregnancy? No, no sexual activity, last time was about 2 months ago. Is the patient on methotrexate? No  Additional Complaints / other details:

## 2021-10-21 NOTE — Progress Notes (Signed)
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   HISTORY OF PRESENT ILLNESS: Laura Sharp is a 21 y.o. female seen at the request of Dr. 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  The patient originally presented with headaches and loss of vision for multiple months.  In December 2022 she had an MRI scan that showed a mass in the left frontal lobe with scattered small cystic foci, this was in [place] and she was seen by neurosurgery in [place], and was encouraged to have surgery.  She canceled this wanting to pursue holistic therapy.  Ultimately in February 2023 she presented to [facility] with a witnessed seizure and a CT head that day on 06/13/2021 showed at least a 5 cm mass in the epicenter of the left frontal operculum.  She was transferred to [facility], and was taken to the operating room with Dr. Lacinda Axon on 06/15/2021 for left frontal stereotactic biopsy with [facility].  This revealed IDH mutant astrocytoma.  She also underwent D&C induced abortion on 07/24/2021.  The team at [facility] recommended in February that she proceed however with neurosurgery and was encouraged to have radiation and Avastin in March but did not follow through with these interventions.  She met with Dr. Mickeal Skinner who recommended consideration of radiation and Avastin given that she is at high risk for inflammation and herniation.  She has refused Temodar however.  She did undergo a repeat MRI of the brain with and without contrast to have a more up-to-date diagnostic scan, which was performed on 10/16/2021 and showed a large heterogeneous left frontal lobe mass with irregular enhancement and  peripherally enhancing changes extending to the corpus callosum, foci of susceptibility likely reflecting hemorrhage was again seen within the lesion and effacement of the left greater than right lateral ventricles with rightward midline shift and persistent the trapping of the right lateral ventricle, central herniation with partial effacement of the suprasellar cistern, the lesion in size and enhancement however had slightly decreased since her prior study but there were some increased diffusion hyperintensity within the lesion.  She is seen to consider definitive radiotherapy.    PREVIOUS RADIATION THERAPY: No   PAST MEDICAL HISTORY:  Past Medical History:  Diagnosis Date   Allergy    Astrocytoma brain tumor ([place]) 06/15/2021   Cancer ([place])        PAST SURGICAL HISTORY: Past Surgical History:  Procedure Laterality Date   HERNIA REPAIR       FAMILY HISTORY:  Family History  Family history unknown: Yes     SOCIAL HISTORY:  reports that she has quit smoking. Her smoking use included cigarettes. She has never used smokeless tobacco. She reports that she does not currently use alcohol. She reports that she does not currently use drugs. The patient is in a relationship with her fiance. She is originally from [place], but has lived in [place] for several years. She does not have children. 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   ALLERGIES: Penicillins   MEDICATIONS:  Current Outpatient Medications  Medication Sig Dispense Refill   dexamethasone (DECADRON) 4 MG  tablet Take 4 mg by mouth 2 (two) times daily.     divalproex (DEPAKOTE) 500 MG DR tablet Take 1 tablet (500 mg total) by mouth 2 (two) times daily. 053 tablet 1   folic acid (FOLVITE) 1 MG tablet Take 4 tablets (4 mg total) by mouth daily. 120 tablet 3   NON FORMULARY Take 1 Dose by mouth daily. [place]     NON FORMULARY Take 1 Dose by mouth 2 (two) times daily.  Quercetin     NON FORMULARY Take 1 Dose by mouth daily. [facility] [place]     Prenatal Vit-Fe Fumarate-FA (NEONATAL VITAMIN) 27-0.8 MG TABS Take 1 tablet by mouth daily at 8 pm. 30 tablet 3   No current facility-administered medications for this encounter.     REVIEW OF SYSTEMS: On review of systems, the patient reports that she is not able to see and at home she is still able to get around with a cane and assistance. She is unable as well to perceive light. She denies any seizure since 10/08/21, but has had some shaking of her arms. She has had dizziness, but is able to feed herself and her fiance reports she has excess saliva that can cause impaired speech. She reports no menstrual cycle since her D&C in March, but has been sexually active following the procedure without contraception but her partner states it's been 2 months since that time. In review of her notes, she is apparently have stool per vagina and is being set up to see family medicine for this.   No other complaints are verbalized.     PHYSICAL EXAM:  Wt Readings from Last 3 Encounters:  10/21/21 167 lb 3.2 oz (75.8 kg)  10/08/21 166 lb 3.2 oz (75.4 kg)  08/26/21 180 lb (81.6 kg)   Temp Readings from Last 3 Encounters:  10/21/21 97.6 F (36.4 C)  10/08/21 97.9 F (36.6 C)  08/26/21 99.1 F (37.3 C) (Oral)   BP Readings from Last 3 Encounters:  10/21/21 111/83  10/08/21 102/69  08/26/21 120/82   Pulse Readings from Last 3 Encounters:  10/21/21 96  10/08/21 81  08/26/21 64   Pain Assessment Pain Score: 0-No pain/10  In general this is a neurologically impaired African American female in no acute distress. She's alert and oriented to person, self, and place. She has exophthalmos of the left eye and somewhat dilated pupils of both eyes. She has constant movement of her hands and fingertips almost appearing like she's stretching them apart from one another, during the visit without control moves her right arm to the side and  touches my leg but upon asking her fiance states she has hallucinations in the last few days or weeks and she acknowledges that she feels like there are 30 people around her. It's unclear if she recognizes these movements that to the writer seem uncontrolled.  Cardiopulmonary assessment is negative for acute distress and she exhibits normal effort.     ECOG = 3  0 - Asymptomatic (Fully active, able to carry on all predisease activities without restriction)  1 - Symptomatic but completely ambulatory (Restricted in physically strenuous activity but ambulatory and able to carry out work of a light or sedentary nature. For example, light housework, office work)  2 - Symptomatic, <50% in bed during the day (Ambulatory and capable of all self care but unable to carry out any work activities. Up and about more than 50% of waking hours)  3 - Symptomatic, >50% in  bed, but not bedbound (Capable of only limited self-care, confined to bed or chair 50% or more of waking hours)  4 - Bedbound (Completely disabled. Cannot carry on any self-care. Totally confined to bed or chair)  5 - Death   Eustace Pen MM, Creech RH, Tormey DC, et al. [id number]). "Toxicity and response criteria of the [facility]". [place] Oncol. 5 (6): 649-55    LABORATORY DATA:  Lab Results  Component Value Date   WBC 11.4 (H) 08/26/2021   HGB 13.7 08/26/2021   HCT 41.1 08/26/2021   MCV 102.0 (H) 08/26/2021   PLT 333 08/26/2021   Lab Results  Component Value Date   NA 139 08/26/2021   K 3.5 08/26/2021   CL 104 08/26/2021   CO2 28 08/26/2021   Lab Results  Component Value Date   ALT 16 06/14/2021   AST 11 (L) 06/14/2021   ALKPHOS 39 06/14/2021   BILITOT 0.9 06/14/2021      RADIOGRAPHY: MR BRAIN W WO CONTRAST  Result Date: 10/16/2021 CLINICAL DATA:  Brain/CNS neoplasm, assess treatment response EXAM: MRI HEAD WITHOUT AND WITH CONTRAST TECHNIQUE: Multiplanar, multiecho pulse sequences of the brain and  surrounding structures were obtained without and with intravenous contrast. CONTRAST:  62m GADAVIST GADOBUTROL 1 MMOL/ML IV SOLN COMPARISON:  08/26/2021 FINDINGS: Brain: Large and heterogeneous left frontal mass with irregular enhancement at the periphery extending to the corpus callosum. Extensive surrounding T2 hyperintensity with some contralateral right frontal extension. Foci of susceptibility likely reflecting hemorrhage are again identified within the lesion. Effacement of the adjacent left greater than right lateral ventricles, rightward midline shift, and persistent but decreased trapping of the right lateral ventricle. Central herniation with partial effacement of the suprasellar cistern. Compared to the prior study, lesion size and enhancement have decreased. Mass effect has slightly improved as a result. There is increased diffusion hyperintensity associated with the lesion. No acute infarction. Vascular: Major vessel flow voids at the skull base are preserved. Skull and upper cervical spine: Normal marrow signal is preserved. Sinuses/Orbits: Mild paranasal sinus mucosal thickening. There is nonspecific soft tissue in the superior nasal cavity. Orbits are unremarkable. Other: Sella is unremarkable.  New left mastoid effusion. IMPRESSION: Decrease in lesion size and enhancement with some improvement in mass effect since 08/26/2021 presumably reflecting bevacizumab therapy. Trapping of the right lateral ventricle has decreased. There remains central herniation with partial effacement of the suprasellar cistern. Electronically Signed   By: PMacy MisM.D.   On: 10/16/2021 15:49       IMPRESSION/PLAN: 1. 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 Dr. MLisbeth Renshawdiscusses the pathology findings and reviews the nature of primary brain malignancy. Neurosurgery does not feel like she would benefit from surgical resection with the size of her tumor and risks associated. She's met with Dr. VMickeal Skinnerwho  recommends chemoradiation and she has declined chemotherapy. During the discussion, her partner states he is disappointed that we are not offering natural therapies in addition to her therapy. I encouraged him to discuss this with Dr. VMickeal Skinneras well.  Dr. MLisbeth Renshawoffers a course of conventional radiotherapy. We discussed the risks, benefits, short, and long term effects of radiotherapy, as well as the definitive dosing, but without chemotherapy, likely an overall palliative intent. She is willing to receive Avastin and sees Dr. VMickeal Skinnernext week to begin infusions. She would like to hold off on making plans for radiation until confirming with Dr. VMickeal Skinner Dr. MLisbeth Renshawdiscusses the delivery and logistics of  radiotherapy and anticipates a course of 6 weeks of radiotherapy to the target in the left frontal lobe. We will follow up with her decision making with Dr. Mickeal Skinner after her appointment next week. 2. 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 The patient will follow up with family medicine, but will likely need further work up of this. I will reach out to Dr. Mickeal Skinner as well given plans for upcoming avastin.  3. Risks of pregnancy. The patient is not on contraceptive medication, and while she and her partner report no intercourse in the last 2 months, given amenorrhea since her procedure, and the symptoms of #2, and now seeing in her records she has some focal endometrial findings by CT in April, we would recommend a pregnancy test prior to proceeding with any radiation.    In a visit lasting 60 minutes, greater than 50% of the time was spent face to face discussing the patient's condition, in preparation for the discussion, and coordinating the patient's care.   The above documentation reflects my direct findings during this shared patient visit. Please see the separate note by Dr. Lisbeth Renshaw on this date for the remainder of the patient's plan of care.    Carola Rhine, [facility]   **Disclaimer: This note  was dictated with voice recognition software. Similar sounding words can inadvertently be transcribed and this note may contain transcription errors which may not have been corrected upon publication of note.**

## 2021-10-27 ENCOUNTER — Inpatient Hospital Stay: Payer: Managed Care, Other (non HMO) | Admitting: Internal Medicine

## 2021-10-27 ENCOUNTER — Other Ambulatory Visit: Payer: Self-pay

## 2021-10-27 ENCOUNTER — Inpatient Hospital Stay: Payer: Managed Care, Other (non HMO)

## 2021-10-27 DIAGNOSIS — C711 Malignant neoplasm of frontal lobe: Secondary | ICD-10-CM | POA: Diagnosis not present

## 2021-10-27 DIAGNOSIS — C719 Malignant neoplasm of brain, unspecified: Secondary | ICD-10-CM

## 2021-10-27 DIAGNOSIS — R569 Unspecified convulsions: Secondary | ICD-10-CM

## 2021-10-27 LAB — CBC WITH DIFFERENTIAL (CANCER CENTER ONLY)
Abs Immature Granulocytes: 0.03 10*3/uL (ref 0.00–0.07)
Basophils Absolute: 0 10*3/uL (ref 0.0–0.1)
Basophils Relative: 1 %
Eosinophils Absolute: 0 10*3/uL (ref 0.0–0.5)
Eosinophils Relative: 1 %
HCT: 37.6 % (ref 36.0–46.0)
Hemoglobin: 12.6 g/dL (ref 12.0–15.0)
Immature Granulocytes: 1 %
Lymphocytes Relative: 32 %
Lymphs Abs: 2.1 10*3/uL (ref 0.7–4.0)
MCH: 33.4 pg (ref 26.0–34.0)
MCHC: 33.5 g/dL (ref 30.0–36.0)
MCV: 99.7 fL (ref 80.0–100.0)
Monocytes Absolute: 0.5 10*3/uL (ref 0.1–1.0)
Monocytes Relative: 7 %
Neutro Abs: 3.8 10*3/uL (ref 1.7–7.7)
Neutrophils Relative %: 58 %
Platelet Count: 337 10*3/uL (ref 150–400)
RBC: 3.77 MIL/uL — ABNORMAL LOW (ref 3.87–5.11)
RDW: 11.9 % (ref 11.5–15.5)
WBC Count: 6.5 10*3/uL (ref 4.0–10.5)
nRBC: 0 % (ref 0.0–0.2)

## 2021-10-27 LAB — VALPROIC ACID LEVEL: Valproic Acid Lvl: <10 ug/mL — ABNORMAL LOW (ref 50.0–100.0)

## 2021-10-28 ENCOUNTER — Telehealth: Payer: Self-pay | Admitting: Radiation Oncology

## 2021-10-28 NOTE — Telephone Encounter (Signed)
I checked the patient's chart to see what decisions were made during her visit with Dr. Mickeal Skinner yesterday, but it appears the patient cancelled her appointment with Dr. Mickeal Skinner and her infusion appointment. I called her fiance Mr. Irving Shows and he had me on speakerphone and they let me know that she did not feel that she is ready for the infusion. I encouraged them to reschedule another appointment with Dr. Mickeal Skinner as last week they stated they wanted Dr. Renda Rolls input prior to making any plans for radiation. They prefer to follow up with Dr. Mickeal Skinner, and will call his office to reschedule her appointments. We will be on hold with radiation until decisions are made to move forward.

## 2021-10-30 ENCOUNTER — Telehealth: Payer: Self-pay | Admitting: Internal Medicine

## 2021-10-30 NOTE — Telephone Encounter (Signed)
Called pt to schedule per 6/21 inbasket, pt husband informed they were not interested in scheduling an in person appt at this time and will call to schedule when ready, MD and desk nurse notified.

## 2021-11-13 ENCOUNTER — Ambulatory Visit (INDEPENDENT_AMBULATORY_CARE_PROVIDER_SITE_OTHER): Payer: Managed Care, Other (non HMO) | Admitting: Family Medicine

## 2021-11-13 ENCOUNTER — Encounter: Payer: Self-pay | Admitting: Family Medicine

## 2021-11-13 VITALS — BP 99/66 | HR 66 | Temp 98.2°F | Resp 16 | Ht 67.0 in | Wt 169.4 lb

## 2021-11-13 DIAGNOSIS — H5440 Blindness, one eye, unspecified eye: Secondary | ICD-10-CM | POA: Diagnosis not present

## 2021-11-13 DIAGNOSIS — Z7689 Persons encountering health services in other specified circumstances: Secondary | ICD-10-CM

## 2021-11-13 DIAGNOSIS — C719 Malignant neoplasm of brain, unspecified: Secondary | ICD-10-CM

## 2021-11-13 NOTE — Progress Notes (Signed)
Patient is here to establish care with provider.   Patient would like a referral for an eye exam  Patient has  husband other with her

## 2021-11-13 NOTE — Progress Notes (Signed)
New Patient Office Visit  Subjective    Patient ID: Laura Sharp, female    DOB: February 22, 2001  Age: 21 y.o. MRN: [id number]  CC:  Chief Complaint  Patient presents with   Establish Care    HPI [name] presents to establish care and for review of chronic med issues including astrocytoma of approx 1 year duration. She reports new onset of blindness over the past 2 months. She is under care of a local oncologist.    Outpatient Encounter Medications as of 11/13/2021  Medication Sig   dexamethasone (DECADRON) 4 MG tablet Take 4 mg by mouth 2 (two) times daily.   divalproex (DEPAKOTE) 500 MG DR tablet Take 1 tablet (500 mg total) by mouth 2 (two) times daily.   folic acid (FOLVITE) 1 MG tablet Take 4 tablets (4 mg total) by mouth daily.   NON FORMULARY Take 1 Dose by mouth daily. [place]   NON FORMULARY Take 1 Dose by mouth 2 (two) times daily. Quercetin   NON FORMULARY Take 1 Dose by mouth daily. [facility] [place]   Prenatal Vit-Fe Fumarate-FA (NEONATAL VITAMIN) 27-0.8 MG TABS Take 1 tablet by mouth daily at 8 pm.   No facility-administered encounter medications on file as of 11/13/2021.    Past Medical History:  Diagnosis Date   Allergy    Astrocytoma brain tumor ([place]) 06/15/2021   Cancer [facility])     Past Surgical History:  Procedure Laterality Date   HERNIA REPAIR      Family History  Family history unknown: Yes    Social History   Socioeconomic History   Marital status: Married    Spouse name: Not on file   Number of children: Not on file   Years of education: Not on file   Highest education level: Not on file  Occupational History   Not on file  Tobacco Use   Smoking status: Former    Types: Cigarettes   Smokeless tobacco: Never  Substance and Sexual Activity   Alcohol use: Not Currently   Drug use: Not Currently   Sexual activity: Yes  Other Topics Concern   Not on file  Social History Narrative   Not on file   Social Determinants of Health    Financial Resource Strain: Not on file  Food Insecurity: Not on file  Transportation Needs: Not on file  Physical Activity: Not on file  Stress: Not on file  Social Connections: Not on file  Intimate Partner Violence: Not on file    Review of Systems  All other systems reviewed and are negative.       Objective    BP 99/66   Pulse 66   Temp 98.2 F (36.8 C) (Oral)   Resp 16   Ht _0  (1.702 m)   Wt 169 lb 6.4 oz (76.8 kg)   SpO2 99%   BMI 26.53 kg/m   Physical Exam Vitals and nursing note reviewed.  Constitutional:      General: She is not in acute distress. Cardiovascular:     Rate and Rhythm: Normal rate and regular rhythm.  Pulmonary:     Effort: Pulmonary effort is normal.     Breath sounds: Normal breath sounds.  Musculoskeletal:     Comments: Utilizing wheelchair  Neurological:     Mental Status: She is alert and oriented to person, place, and time.  Psychiatric:        Mood and Affect: Mood normal.  Behavior: Behavior normal.         Assessment & Plan:   1. Anaplastic astrocytoma, IDH-mutant ([place]) Management as per consultant.   2. Blindness of left eye, unspecified right eye visual impairment category Referral for new onset symptoms. Most likely 2/2 above. Form for disability plate  completed.  - Ambulatory referral to Ophthalmology  3. Encounter to establish care     No follow-ups on file.   Becky Sax, MD

## 2021-11-30 ENCOUNTER — Other Ambulatory Visit: Payer: Self-pay

## 2021-12-04 ENCOUNTER — Other Ambulatory Visit: Payer: Self-pay | Admitting: Internal Medicine

## 2022-03-19 ENCOUNTER — Other Ambulatory Visit: Payer: Self-pay

## 2022-05-18 ENCOUNTER — Other Ambulatory Visit: Payer: Self-pay

## 2023-06-21 IMAGING — MR MR HEAD WO/W CM
13 series · 48 of 48 positions shown · IV contrast (gadavist)
Comparison: 06/14/2021 outside MRI

CLINICAL DATA: Anaplastic astrocytoma, assess treatment response

EXAM:
MRI HEAD WITHOUT AND WITH CONTRAST
TECHNIQUE: Multiplanar, multiecho pulse sequences of the brain and surrounding
structures were obtained without and with intravenous contrast.
CONTRAST:  7.5mL GADAVIST GADOBUTROL 1 MMOL/ML IV SOLN

[Series 5: DWI · axial · 3.0mm · 1.36mm/px · z∈[-67,+73]mm · 5 of 96 slices shown (1 of 2)]
[im 1/96]
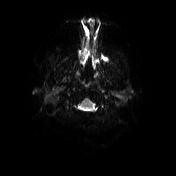
[im 24/96]
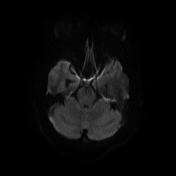
[im 48/96]
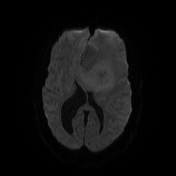
[im 72/96]
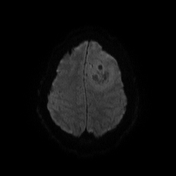
[im 96/96]
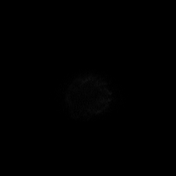

[Series 6: DWI · axial · 3.0mm · 1.36mm/px · z∈[-67,+73]mm · 2 of 48 slices shown (2 of 2)]
[im 1/48]
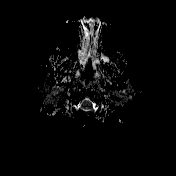
[im 48/48]
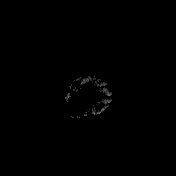

[Series 7: T1 · sagittal · 5.0mm · 0.75mm/px · 1 of 25 slices shown (1 of 2)]
[im 1/25]
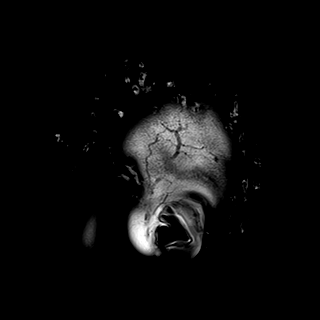

[Series 8: T2 · axial · 5.0mm · 0.62mm/px · z∈[-102,+58]mm · 2 of 26 slices shown]
[im 1/26]
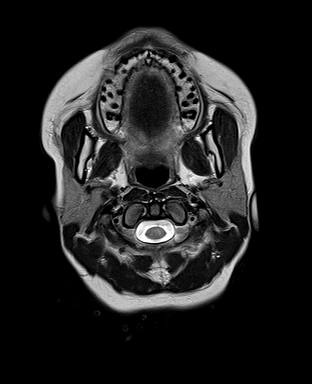
[im 26/26]
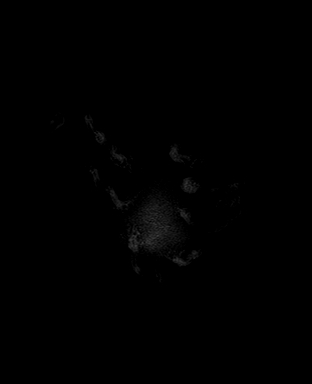

[Series 9: swi_images · axial · 3.0mm · 0.75mm/px · z∈[-97,+54]mm · 3 of 52 slices shown]
[im 1/52]
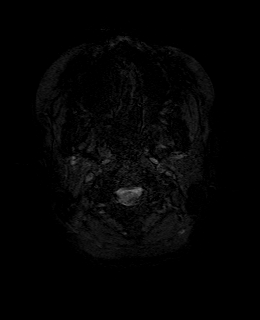
[im 26/52]
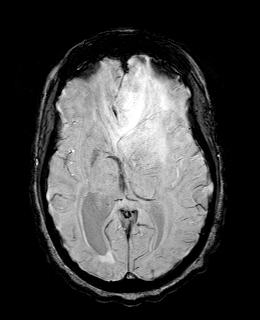
[im 52/52]
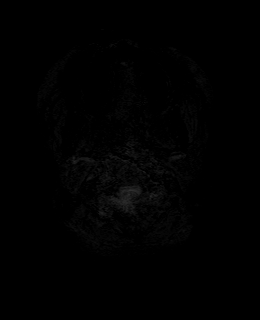

[Series 11: FLAIR · axial · 3.0mm · 0.75mm/px · z∈[-97,+54]mm · 3 of 52 slices shown]
[im 1/52]
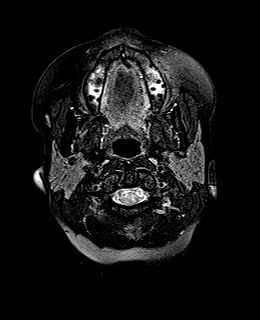
[im 26/52]
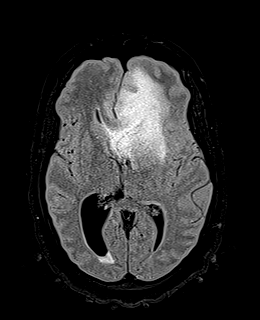
[im 52/52]
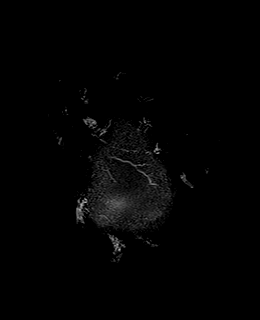

[Series 12: T1 · axial · 1.0mm · 0.94mm/px · z∈[-98,+59]mm · 10 of 160 slices shown (2 of 2)]
[im 1/160]
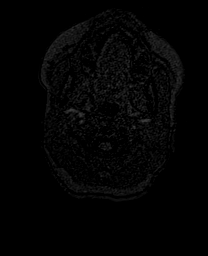
[im 18/160]
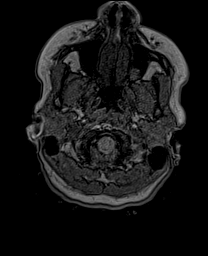
[im 36/160]
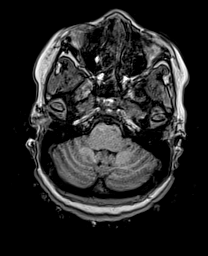
[im 54/160]
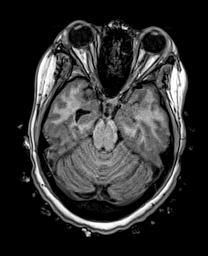
[im 71/160]
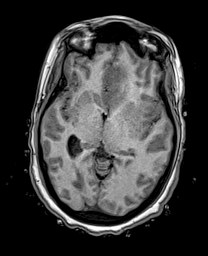
[im 89/160]
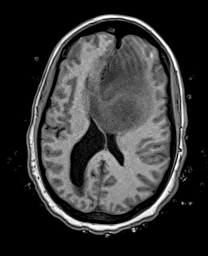
[im 107/160]
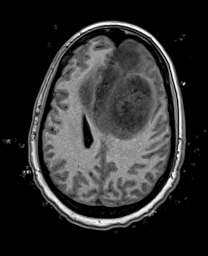
[im 124/160]
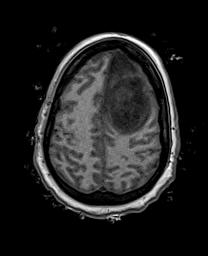
[im 142/160]
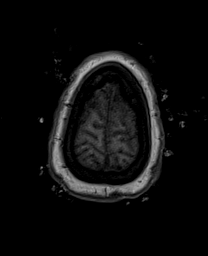
[im 160/160]
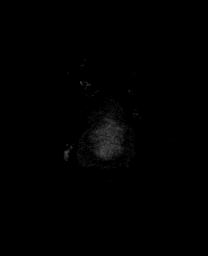

[Series 13: cor dwi_tracew · coronal · 5.0mm · 1.53mm/px · 4 of 62 slices shown]
[im 1/62]
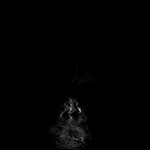
[im 21/62]
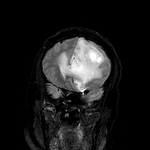
[im 41/62]
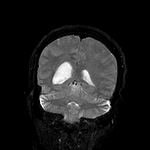
[im 62/62]
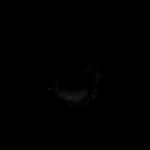

[Series 14: cor dwi_adc · coronal · 5.0mm · 1.53mm/px · 2 of 31 slices shown]
[im 1/31]
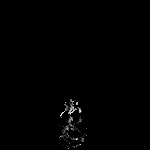
[im 31/31]
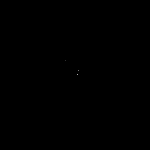

[Series 15: T2 post-contrast · coronal · 5.0mm · 0.57mm/px · 2 of 31 slices shown]
[im 1/31]
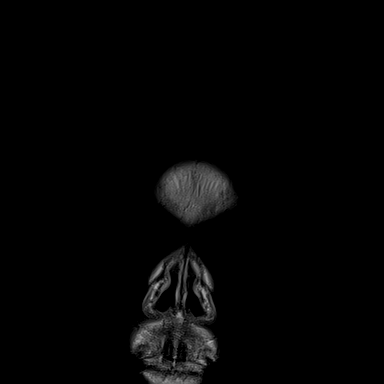
[im 31/31]
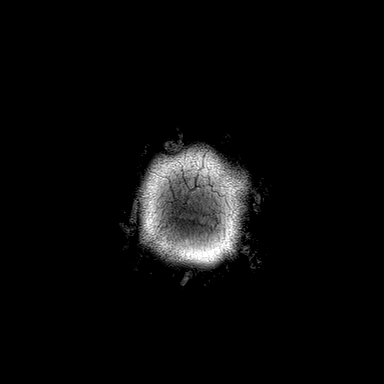

[Series 16: T1 post-contrast · axial · 1.0mm · 0.94mm/px · z∈[-98,+59]mm · 10 of 160 slices shown (1 of 3)]
[im 1/160]
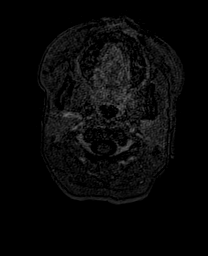
[im 18/160]
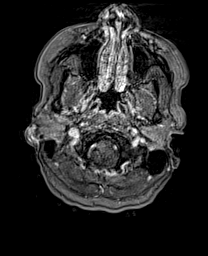
[im 36/160]
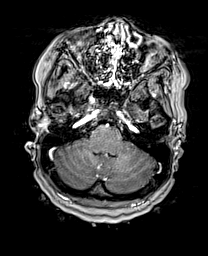
[im 54/160]
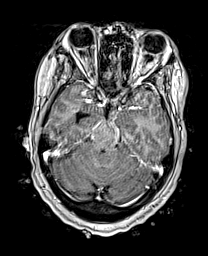
[im 71/160]
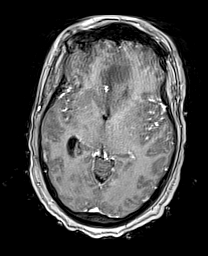
[im 89/160]
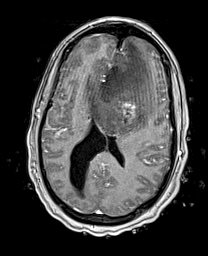
[im 107/160]
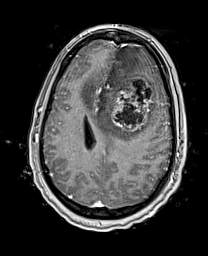
[im 124/160]
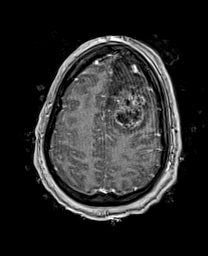
[im 142/160]
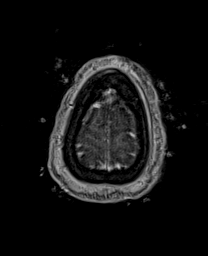
[im 160/160]
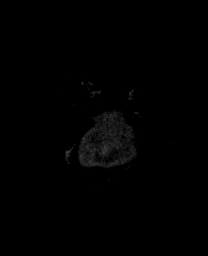

[Series 17: T1 post-contrast · coronal · 5.0mm · 0.43mm/px · 2 of 31 slices shown (2 of 3)]
[im 1/31]
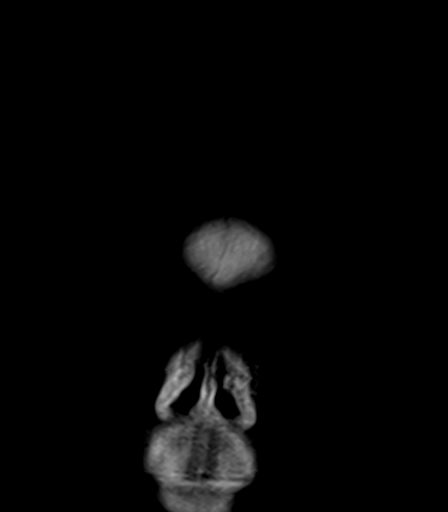
[im 31/31]
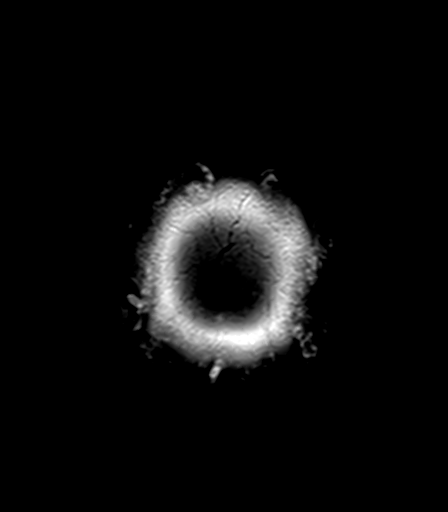

[Series 18: T1 post-contrast · sagittal · 5.0mm · 0.75mm/px · 2 of 25 slices shown (3 of 3)]
[im 1/25]
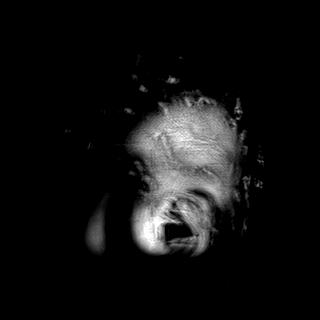
[im 25/25]
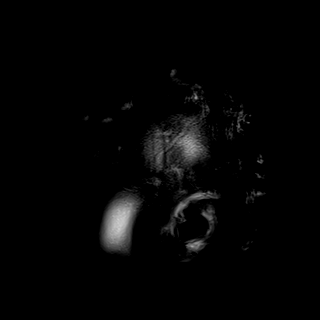

[48 of 48 positions shown; findings below may reference images not displayed]

FINDINGS: Brain: Redemonstrated ill-defined mass centered in the left frontal
lobe. The mass demonstrates heterogeneous enhancement and internal
cystic changes. The peripherally enhancing portion of the mass
measures approximately 5.5 x 3.5 x 5.4 cm (AP x TR x CC) (series 16,
image 111 and series 17, image 15), previously 4.3 x 3.0 x 4.2 cm
when remeasured similarly. Significant surrounding T2 hyperintense
signal extends anteriorly into the left frontal lobe and across the
corpus callosum into the right frontal lobe and has increased
slightly since the prior exam. Approximately 1.5 cm of left-to-right
midline shift, previously 1.2 cm when remeasured similarly with
redemonstrated subfalcine herniation.

Mass effect on the frontal horns and bodies of the
left-greater-than-right lateral ventricles, with redemonstrated
enlargement of the remainder of the right lateral ventricle,
compared to the left, concerning for entrapment. This has progressed
slightly since the prior exam. Increased T2 signal surrounding the
right ventricle suggest transependymal flow of CSF. Normal third and
fourth ventricles.

No restricted diffusion to suggest acute or subacute infarct.

Vascular: Normal flow voids.

Skull and upper cervical spine: Normal marrow signal.

Sinuses/Orbits: Left maxillary mucous retention cyst. Mild mucosal
thickening in the ethmoid air cells and right frontal sinus. The
orbits are unremarkable.

Other: The mastoids are well aerated.
IMPRESSION: 1. Interval increase in the size of the heterogeneously enhancing
mass in the left frontal lobe, concerning for tumor progression.
Increased surrounding edema and mass effect result in 1.5 cm of
left-to-right midline shift and redemonstrated subfalcine
herniation.
2. Mass effect on the left-greater-than-right lateral ventricle,
with progressive enlargement of the occipital and temporal horns of
the right lateral ventricle, concerning for entrapment, with
increased T2 signal surrounding ventricle, concerning for
transependymal flow CSF.
